# Patient Record
Sex: Female | Born: 2005 | Race: White | Hispanic: Yes | Marital: Single | Smoking: Never smoker
Health system: Southern US, Community
[De-identification: ages and names within clinical notes are randomized; demographics above are authoritative.]

## PROBLEM LIST (undated history)

## (undated) ENCOUNTER — Telehealth

## (undated) ENCOUNTER — Encounter

## (undated) ENCOUNTER — Encounter: Attending: Pediatric Nephrology | Primary: Pediatric Nephrology

## (undated) ENCOUNTER — Ambulatory Visit

## (undated) ENCOUNTER — Telehealth: Attending: Pediatric Nephrology | Primary: Pediatric Nephrology

## (undated) ENCOUNTER — Ambulatory Visit: Payer: PRIVATE HEALTH INSURANCE

## (undated) ENCOUNTER — Ambulatory Visit: Payer: PRIVATE HEALTH INSURANCE | Attending: Pediatric Nephrology | Primary: Pediatric Nephrology

## (undated) ENCOUNTER — Ambulatory Visit: Payer: Medicaid (Managed Care) | Attending: Pediatric Nephrology | Primary: Pediatric Nephrology

## (undated) ENCOUNTER — Telehealth: Attending: Internal Medicine | Primary: Internal Medicine

## (undated) ENCOUNTER — Encounter: Attending: Pediatrics | Primary: Pediatrics

## (undated) ENCOUNTER — Ambulatory Visit: Payer: PRIVATE HEALTH INSURANCE | Attending: Pediatric Infectious Diseases | Primary: Pediatric Infectious Diseases

---
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
## 2023-01-02 DIAGNOSIS — D849 Immunodeficiency, unspecified: Principal | ICD-10-CM

## 2023-01-02 DIAGNOSIS — Z940 Kidney transplant status: Principal | ICD-10-CM

## 2023-01-04 LAB — CBC W/ DIFFERENTIAL
BANDED NEUTROPHILS ABSOLUTE COUNT: 0 10*3/uL (ref 0.0–0.1)
BASOPHILS ABSOLUTE COUNT: 0 10*3/uL (ref 0.0–0.2)
BASOPHILS RELATIVE PERCENT: 1 %
EOSINOPHILS ABSOLUTE COUNT: 0.1 10*3/uL (ref 0.0–0.4)
EOSINOPHILS RELATIVE PERCENT: 3 %
HEMATOCRIT: 31.8 % — ABNORMAL LOW (ref 34.0–46.6)
HEMOGLOBIN: 10.5 g/dL — ABNORMAL LOW (ref 11.1–15.9)
IMMATURE GRANULOCYTES: 0 %
LYMPHOCYTES ABSOLUTE COUNT: 0.9 10*3/uL (ref 0.7–3.1)
LYMPHOCYTES RELATIVE PERCENT: 23 %
MEAN CORPUSCULAR HEMOGLOBIN CONC: 33 g/dL (ref 31.5–35.7)
MEAN CORPUSCULAR HEMOGLOBIN: 27.3 pg (ref 26.6–33.0)
MEAN CORPUSCULAR VOLUME: 83 fL (ref 79–97)
MONOCYTES ABSOLUTE COUNT: 0.3 10*3/uL (ref 0.1–0.9)
MONOCYTES RELATIVE PERCENT: 7 %
NEUTROPHILS ABSOLUTE COUNT: 2.6 10*3/uL (ref 1.4–7.0)
NEUTROPHILS RELATIVE PERCENT: 66 %
PLATELET COUNT: 280 10*3/uL (ref 150–450)
RED BLOOD CELL COUNT: 3.84 x10E6/uL (ref 3.77–5.28)
RED CELL DISTRIBUTION WIDTH: 12.8 % (ref 11.7–15.4)
WHITE BLOOD CELL COUNT: 3.9 10*3/uL (ref 3.4–10.8)

## 2023-01-04 LAB — RENAL FUNCTION PANEL
ALBUMIN: 4.5 g/dL (ref 4.0–5.0)
BLOOD UREA NITROGEN: 21 mg/dL — ABNORMAL HIGH (ref 6–20)
BUN / CREAT RATIO: 16 (ref 9–23)
CALCIUM: 9.5 mg/dL (ref 8.7–10.2)
CHLORIDE: 109 mmol/L — ABNORMAL HIGH (ref 96–106)
CO2: 19 mmol/L — ABNORMAL LOW (ref 20–29)
CREATININE: 1.35 mg/dL — ABNORMAL HIGH (ref 0.57–1.00)
EGFR: 58 mL/min/{1.73_m2} — ABNORMAL LOW
GLUCOSE: 94 mg/dL (ref 70–99)
PHOSPHORUS, SERUM: 3.7 mg/dL (ref 3.3–5.1)
POTASSIUM: 4.3 mmol/L (ref 3.5–5.2)
SODIUM: 141 mmol/L (ref 134–144)

## 2023-01-04 LAB — MAGNESIUM: MAGNESIUM: 1.8 mg/dL (ref 1.6–2.3)

## 2023-01-05 LAB — TACROLIMUS LEVEL: TACROLIMUS BLOOD: 5.9 ng/mL (ref 2.0–20.0)

## 2023-01-05 LAB — CMV DNA, QUANTITATIVE, PCR: CMV QUANT: NEGATIVE [IU]/mL

## 2023-01-05 NOTE — Unmapped (Signed)
refill 

## 2023-01-05 NOTE — Unmapped (Signed)
Transplant Lab Follow-Up    Patient was contacted to discuss laboratory results. Tacrolimus level was within goal range at 5.9 ng/mL (goal 5-7 ng/mL). Plan to continue current tacrolimus (Prograf) dose of 0.9 mg every 12 hours. Patient will get repeat labs in 1 month. Plan to follow-up with other labwork as results made available. All questions answered. Patient verbalized understanding & agreed with the plan.    Laboratory Results Summary     Immunosuppression  Regimen: Tacrolimus 0.9 mg every 12 hours  Trough Goal: 5-7 ng/mL    Lab Results   Component Value Date    TACROLIMUS 5.9 01/03/2023    TACROLIMUS 8.0 12/20/2022    TACROLIMUS 12.5 12/07/2022     Infectious Disease/Viral Testing    Lab Results   Component Value Date    CMV Viral Ld Not Detected 11/07/2022    CMV Viral Ld Not Detected 11/07/2022     Lab Results   Component Value Date    CMV Quant Negative 01/03/2023     Other Laboratory Results  Lab Results   Component Value Date    BUN 21 (H) 01/03/2023    CREATININE 1.35 (H) 01/03/2023    K 4.3 01/03/2023    GLU 95 11/07/2022    MG 1.8 01/03/2023     Lab Results   Component Value Date    WBC 3.9 01/03/2023    HGB 10.5 (L) 01/03/2023    HCT 31.8 (L) 01/03/2023    PLT 280 01/03/2023    NEUTROABS 2.6 01/03/2023       Melina Modena, PharmD, BCPPS, CPP  Clinical Pharmacist Practitioner - Pediatric Solid Organ Transplant

## 2023-01-06 MED ORDER — TACROLIMUS ORAL SUS 1MG/ML (CAPS)
Freq: Two times a day (BID) | ORAL | 11 refills | 30 days | Status: CP
Start: 2023-01-06 — End: ?
  Filled 2023-01-17: qty 54, 30d supply, fill #0

## 2023-01-09 DIAGNOSIS — Z940 Kidney transplant status: Principal | ICD-10-CM

## 2023-01-09 DIAGNOSIS — D849 Immunodeficiency, unspecified: Principal | ICD-10-CM

## 2023-01-09 NOTE — Unmapped (Addendum)
[facility] Pharmacist has reviewed a new prescription for tacrolimus that indicates a dose decrease.  Patient was counseled on this dosage change by cpp JP- see epic note from 7/15, 7/26, 8/8.  Next refill call date adjusted if necessary.        Clinical Assessment Needed For: Dose Change  Medication: Tacrolimus 1mg /mL oral suspension  Last Fill Date/Day Supply: 12/13/2022 / 30 days  Copay $0  Was previous dose already scheduled to fill: No    Notes to Pharmacist: N/A

## 2023-01-10 NOTE — Unmapped (Signed)
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     Was a Nurse, learning disability used for this call? No    Completed refill call assessment today to schedule patient's medication shipment from the [facility] [phone]).  All relevant notes have been reviewed.     Specialty medication(s) and dose(s) confirmed: Regimen is correct and unchanged.   Changes to medications: Lilly reports no changes at this time.  Changes to insurance: No  New side effects reported not previously addressed with a pharmacist or physician: None reported  Questions for the pharmacist: No    Confirmed patient received a Conservation officer, historic buildings and a Surveyor, mining with first shipment. The patient will receive a drug information handout for each medication shipped and additional FDA Medication Guides as required.       DISEASE/MEDICATION-SPECIFIC INFORMATION        N/A    SPECIALTY MEDICATION ADHERENCE     Medication Adherence    Patient reported X missed doses in the last month: 0  Specialty Medication: mycophenolate 200 mg/mL suspension (CELLCEPT)  Patient is on additional specialty medications: No              Were doses missed due to medication being on hold? No    Mycophenolate  200 mg/ml: 2 days of medicine on hand     REFERRAL TO PHARMACIST     Referral to the pharmacist: Not needed      [facility]     Shipping address confirmed in Epic.       Delivery Scheduled: Yes, Expected medication delivery date: 01/11/23.     Medication will be delivered via Same Day Courier to the prescription address in Epic WAM.    [name]   [facility] Specialty Technician

## 2023-01-10 NOTE — Unmapped (Signed)
[facility] Pharmacy Clinical Intervention    Type of intervention: Narrow therapeutic index drug    Medication involved: tacrolimus    Problem identified: mfg change in special formulations compound due to [place] alignment    Intervention performed: pt and clinic ok with change. Pt aware to alert clinic on switch date as bloodwork may be needed    Follow-up needed: clinic aware    Approximate time spent: 5-10 minutes    Clinical evidence used to support intervention: FDA Orange Book    Result of the intervention: Prevention of an adverse drug event    Thad Ranger, PharmD   [facility] Specialty Pharmacist

## 2023-01-10 NOTE — Unmapped (Signed)
[facility] Specialty Pharmacy Refill Coordination Note    Specialty Medication(s) to be Shipped:   Transplant: tacrolimus oral suspension 1mg /ml    Other medication(s) to be shipped: No additional medications requested for fill at this time     Michelle [name], DOB: Jun 21, 2005  Phone: [phone] (home)       All above HIPAA information was verified with patient.     Was a Nurse, learning disability used for this call? No    Completed refill call assessment today to schedule patient's medication shipment from the [facility] [phone]).  All relevant notes have been reviewed.     Specialty medication(s) and dose(s) confirmed: Regimen is correct and unchanged.   Changes to medications: Merritt reports no changes at this time.  Changes to insurance: No  New side effects reported not previously addressed with a pharmacist or physician: None reported  Questions for the pharmacist: No    Confirmed patient received a Conservation officer, historic buildings and a Surveyor, mining with first shipment. The patient will receive a drug information handout for each medication shipped and additional FDA Medication Guides as required.       DISEASE/MEDICATION-SPECIFIC INFORMATION        N/A    SPECIALTY MEDICATION ADHERENCE     Medication Adherence    Patient reported X missed doses in the last month: 0  Specialty Medication: tacrolimus 1 mg/mL oral suspension (CAPS)  Patient is on additional specialty medications: No              Were doses missed due to medication being on hold? No    tacrolimus 1 mg/ml: 10 days of medicine on hand       REFERRAL TO PHARMACIST     Referral to the pharmacist: Not needed      [facility]     Shipping address confirmed in Epic.       Delivery Scheduled: Yes, Expected medication delivery date: 01/17/23.     Medication will be delivered via Same Day Courier to the prescription address in Epic WAM.    [name]   [facility] Specialty Technician

## 2023-01-11 NOTE — Unmapped (Signed)
Vaughn Dighton 's Mycophenolate shipment will be delayed as a result of the medication is too soon to refill until 8/15.     I have reached out to the patient via interpreter at [phone]  and left a voicemail message.  We will wait for a call back from the patient to reschedule the delivery.  We have not confirmed the new delivery date.

## 2023-01-12 NOTE — Unmapped (Signed)
Michelle [name] 's mycophenolate 200 mg/mL suspension (CELLCEPT) shipment will be sent out as a result of RTS     I have spoken with the patient  via incoming phone call and communicated the delivery change. We will reschedule the medication for the delivery date that the patient agreed upon.  We have confirmed the delivery date as 01/12/2023, via same day courier.

## 2023-01-13 MED FILL — MYCOPHENOLATE MOFETIL 200 MG/ML ORAL POWDER FOR SUSPENSION: ORAL | 34 days supply | Qty: 160 | Fill #7

## 2023-01-16 DIAGNOSIS — D849 Immunodeficiency, unspecified: Principal | ICD-10-CM

## 2023-01-16 DIAGNOSIS — Z940 Kidney transplant status: Principal | ICD-10-CM

## 2023-01-23 DIAGNOSIS — Z940 Kidney transplant status: Principal | ICD-10-CM

## 2023-01-23 DIAGNOSIS — D849 Immunodeficiency, unspecified: Principal | ICD-10-CM

## 2023-01-25 DIAGNOSIS — R300 Dysuria: Principal | ICD-10-CM

## 2023-01-26 ENCOUNTER — Ambulatory Visit: Admit: 2023-01-26 | Discharge: 2023-01-27 | Payer: PRIVATE HEALTH INSURANCE

## 2023-01-26 DIAGNOSIS — R300 Dysuria: Principal | ICD-10-CM

## 2023-01-26 LAB — URINALYSIS WITH MICROSCOPY WITH CULTURE REFLEX PERFORMABLE
BILIRUBIN UA: NEGATIVE
GLUCOSE UA: NEGATIVE
KETONES UA: NEGATIVE
NITRITE UA: NEGATIVE
PH UA: 5.5 (ref 5.0–9.0)
RBC UA: 1 /HPF (ref <=4)
SPECIFIC GRAVITY UA: 1.015 (ref 1.003–1.030)
SQUAMOUS EPITHELIAL: 1 /HPF (ref 0–5)
UROBILINOGEN UA: <2
WBC UA: 5 /HPF (ref 0–5)

## 2023-01-27 DIAGNOSIS — R300 Dysuria: Principal | ICD-10-CM

## 2023-01-27 MED ORDER — SULFAMETHOXAZOLE 800 MG-TRIMETHOPRIM 160 MG TABLET
ORAL_TABLET | Freq: Two times a day (BID) | ORAL | 0 refills | 3 days | Status: CP
Start: 2023-01-27 — End: 2023-01-30

## 2023-01-27 NOTE — Unmapped (Signed)
Intermittent 10/10 bladder pain  Dysuria x 2 days  No fever  UA with trace LE and small blood  Sending bactrim

## 2023-01-30 DIAGNOSIS — Z940 Kidney transplant status: Principal | ICD-10-CM

## 2023-01-30 DIAGNOSIS — D849 Immunodeficiency, unspecified: Principal | ICD-10-CM

## 2023-02-01 LAB — CBC W/ DIFFERENTIAL
BANDED NEUTROPHILS ABSOLUTE COUNT: 0 10*3/uL (ref 0.0–0.1)
BASOPHILS ABSOLUTE COUNT: 0 10*3/uL (ref 0.0–0.2)
BASOPHILS RELATIVE PERCENT: 1 %
EOSINOPHILS ABSOLUTE COUNT: 0.1 10*3/uL (ref 0.0–0.4)
EOSINOPHILS RELATIVE PERCENT: 2 %
HEMATOCRIT: 33.4 % — ABNORMAL LOW (ref 34.0–46.6)
HEMOGLOBIN: 10.8 g/dL — ABNORMAL LOW (ref 11.1–15.9)
IMMATURE GRANULOCYTES: 0 %
LYMPHOCYTES ABSOLUTE COUNT: 0.9 10*3/uL (ref 0.7–3.1)
LYMPHOCYTES RELATIVE PERCENT: 18 %
MEAN CORPUSCULAR HEMOGLOBIN CONC: 32.3 g/dL (ref 31.5–35.7)
MEAN CORPUSCULAR HEMOGLOBIN: 26.6 pg (ref 26.6–33.0)
MEAN CORPUSCULAR VOLUME: 82 fL (ref 79–97)
MONOCYTES ABSOLUTE COUNT: 0.4 10*3/uL (ref 0.1–0.9)
MONOCYTES RELATIVE PERCENT: 8 %
NEUTROPHILS ABSOLUTE COUNT: 3.4 10*3/uL (ref 1.4–7.0)
NEUTROPHILS RELATIVE PERCENT: 71 %
PLATELET COUNT: 252 10*3/uL (ref 150–450)
RED BLOOD CELL COUNT: 4.06 x10E6/uL (ref 3.77–5.28)
RED CELL DISTRIBUTION WIDTH: 12.5 % (ref 11.7–15.4)
WHITE BLOOD CELL COUNT: 4.8 10*3/uL (ref 3.4–10.8)

## 2023-02-01 LAB — RENAL FUNCTION PANEL
ALBUMIN: 4.5 g/dL (ref 4.0–5.0)
BLOOD UREA NITROGEN: 22 mg/dL — ABNORMAL HIGH (ref 6–20)
BUN / CREAT RATIO: 16 (ref 9–23)
CALCIUM: 9.8 mg/dL (ref 8.7–10.2)
CHLORIDE: 107 mmol/L — ABNORMAL HIGH (ref 96–106)
CO2: 18 mmol/L — ABNORMAL LOW (ref 20–29)
CREATININE: 1.36 mg/dL — ABNORMAL HIGH (ref 0.57–1.00)
EGFR: 58 mL/min/{1.73_m2} — ABNORMAL LOW
GLUCOSE: 93 mg/dL (ref 70–99)
PHOSPHORUS, SERUM: 3.6 mg/dL (ref 3.3–5.1)
POTASSIUM: 4.5 mmol/L (ref 3.5–5.2)
SODIUM: 139 mmol/L (ref 134–144)

## 2023-02-01 LAB — CMV DNA, QUANTITATIVE, PCR: CMV QUANT: NEGATIVE [IU]/mL

## 2023-02-01 LAB — MAGNESIUM: MAGNESIUM: 1.6 mg/dL (ref 1.6–2.3)

## 2023-02-03 LAB — TACROLIMUS LEVEL: TACROLIMUS BLOOD: 7 ng/mL (ref 2.0–20.0)

## 2023-02-03 NOTE — Unmapped (Signed)
Transplant Lab Follow-Up    Patient was contacted to discuss laboratory results. Tacrolimus level was within goal range at 7.0 ng/mL (goal 5-7 ng/mL). Plan to continue current tacrolimus (Prograf) dose of 0.9 mg every 12 hours. Patient will get repeat labs in 1 month. Plan to follow-up with other labwork as results made available. All questions answered. Patient verbalized understanding & agreed with the plan.    Laboratory Results Summary     Immunosuppression  Regimen: Tacrolimus 0.9 mg every 12 hours  Trough Goal: 5-7 ng/mL    Lab Results   Component Value Date    TACROLIMUS 7.0 01/31/2023    TACROLIMUS 5.9 01/03/2023    TACROLIMUS 8.0 12/20/2022     Infectious Disease/Viral Testing    Lab Results   Component Value Date    CMV Viral Ld Not Detected 11/07/2022    CMV Viral Ld Not Detected 11/07/2022     Lab Results   Component Value Date    CMV Quant Negative 01/31/2023     Other Laboratory Results  Lab Results   Component Value Date    BUN 22 (H) 01/31/2023    CREATININE 1.36 (H) 01/31/2023    K 4.5 01/31/2023    GLU 95 11/07/2022    MG 1.6 01/31/2023     Lab Results   Component Value Date    WBC 4.8 01/31/2023    HGB 10.8 (L) 01/31/2023    HCT 33.4 (L) 01/31/2023    PLT 252 01/31/2023    NEUTROABS 3.4 01/31/2023       Melina Modena, PharmD, BCPPS, CPP  Clinical Pharmacist Practitioner - Pediatric Solid Organ Transplant

## 2023-02-06 DIAGNOSIS — Z940 Kidney transplant status: Principal | ICD-10-CM

## 2023-02-06 DIAGNOSIS — D849 Immunodeficiency, unspecified: Principal | ICD-10-CM

## 2023-02-08 NOTE — Unmapped (Signed)
[facility] Clinical Assessment & Refill Coordination Note    Michelle [name], DOB: [birth year]  Phone: [phone] (home)     All above HIPAA information was verified with patient's family member, Spoke to [employer].     Was a Nurse, learning disability used for this call? Yes, [id number]. Patient language is appropriate in [facility]    Specialty Medication(s):   Transplant: tacrolimus oral suspension 1mg /ml and Mycophenolate 200mg /ml     Current Outpatient Medications   Medication Sig Dispense Refill    amlodipine (NORVASC) 5 MG tablet Take 1 tablet (5 mg total) by mouth daily. 30 tablet 11    carvedilol (COREG) 25 MG tablet Take 1 tablet (25 mg total) by mouth Two (2) times a day. 60 tablet 11    mycophenolate (CELLCEPT) 200 mg/mL suspension Take 2.3 mL (460 mg total) by mouth two (2) times a day. 160 mL 11    tacrolimus 1 mg/mL oral suspension (CAPS) Take 0.9 mL (0.9 mg total) by mouth two (2) times a day. 54 mL 11     No current facility-administered medications for this visit.        Changes to medications: Chardonae reports no changes at this time.    Allergies   Allergen Reactions    Latex      Added based on information entered during case entry, please review and add reactions, type, and severity as needed       Changes to allergies: No    SPECIALTY MEDICATION ADHERENCE     Mycophenolate 200 mg/ml: 10 days of medicine on hand   Tacrolimus 1 mg/ml: 10 days of medicine on hand     Medication Adherence    Patient reported X missed doses in the last month: 0  Specialty Medication: Tacrolimus 1mg /ml  Patient is on additional specialty medications: Yes  Additional Specialty Medications: Mycophenolate 200mg /ml  Patient Reported Additional Medication X Missed Doses in the Last Month: 0  Patient is on more than two specialty medications: No          Specialty medication(s) dose(s) confirmed: Regimen is correct and unchanged.     Are there any concerns with adherence? No    Adherence counseling provided? Not needed    CLINICAL MANAGEMENT AND INTERVENTION      Clinical Benefit Assessment:    Do you feel the medicine is effective or helping your condition? Yes    Clinical Benefit counseling provided? Not needed    Adverse Effects Assessment:    Are you experiencing any side effects? No    Are you experiencing difficulty administering your medicine? No    Quality of Life Assessment:    Quality of Life    Rheumatology  Oncology  Dermatology  Cystic Fibrosis          How many days over the past month did your kidney transplant  keep you from your normal activities? For example, brushing your teeth or getting up in the morning. 0    Have you discussed this with your provider? Not needed    Acute Infection Status:    Acute infections noted within Epic:  No active infections  Patient reported infection: None    Therapy Appropriateness:    Is therapy appropriate based on current medication list, adverse reactions, adherence, clinical benefit and progress toward achieving therapeutic goals? Yes, therapy is appropriate and should be continued     DISEASE/MEDICATION-SPECIFIC INFORMATION      N/A    Solid Organ Transplant:  Not Applicable    PATIENT SPECIFIC NEEDS     Does the patient have any physical, cognitive, or cultural barriers? No    Is the patient high risk? Yes, patient is taking a REMS drug. Medication is dispensed in compliance with REMS program    Did the patient require a clinical intervention? No    Does the patient require physician intervention or other additional services (i.e., nutrition, smoking cessation, social work)? No    SOCIAL DETERMINANTS OF HEALTH     At the [facility], we have learned that life circumstances - like trouble affording food, housing, utilities, or transportation can affect the health of many of our patients.   That is why we wanted to ask: are you currently experiencing any life circumstances that are negatively impacting your health and/or quality of life? Patient declined to answer    Social Determinants of Health     Financial Resource Strain: Low Risk  (04/14/2021)    Overall Financial Resource Strain (CARDIA)     Difficulty of Paying Living Expenses: Not very hard   Internet Connectivity: Not on file   Food Insecurity: No Food Insecurity (04/14/2021)    Hunger Vital Sign     Worried About Running Out of Food in the Last Year: Never true     Ran Out of Food in the Last Year: Never true   Tobacco Use: Low Risk  (06/20/2022)    Patient History     Smoking Tobacco Use: Never     Smokeless Tobacco Use: Never     Passive Exposure: Never   Housing/Utilities: Low Risk  (04/14/2021)    Housing/Utilities     Within the past 12 months, have you ever stayed: outside, in a car, in a tent, in an overnight shelter, or temporarily in someone else's home (i.e. couch-surfing)?: No     Are you worried about losing your housing?: No     Within the past 12 months, have you been unable to get utilities (heat, electricity) when it was really needed?: No   Alcohol Use: Not At Risk (06/17/2021)    Alcohol Use     How often do you have a drink containing alcohol?: Never     How many drinks containing alcohol do you have on a typical day when you are drinking?: Not on file     How often do you have 5 or more drinks on one occasion?: Never   Transportation Needs: No Transportation Needs (04/14/2021)    PRAPARE - Transportation     Lack of Transportation (Medical): No     Lack of Transportation (Non-Medical): No   Substance Use: Low Risk  (06/17/2021)    Substance Use     Taken prescription drugs for non-medical reasons: Never     Taken illegal drugs: Never     Patient indicated they have taken drugs in the past year for non-medical reasons: Yes, [positive answer(s)]: Not on file   Health Literacy: Not on file   Physical Activity: Not on file   Interpersonal Safety: Unknown (02/08/2023)    Interpersonal Safety     Unsafe Where You Currently Live: Not on file     Physically Hurt by Anyone: Not on file Abused by Anyone: Not on file   Stress: Not on file   Intimate Partner Violence: Not on file   Depression: Not on file   Social Connections: Not on file       Would you be willing to receive help  with any of the needs that you have identified today? Not applicable       SHIPPING     Specialty Medication(s) to be Shipped:   Transplant: tacrolimus oral suspension 1mg /ml and Mycophenolate    Other medication(s) to be shipped: No additional medications requested for fill at this time     Changes to insurance: No    Delivery Scheduled: Yes, Expected medication delivery date: 02/15/23.     Medication will be delivered via Same Day Courier to the confirmed prescription address in [facility].    The patient will receive a drug information handout for each medication shipped and additional FDA Medication Guides as required.  Verified that patient has previously received a Conservation officer, historic buildings and a Surveyor, mining.    The patient or caregiver noted above participated in the development of this care plan and knows that they can request review of or adjustments to the care plan at any time.      All of the patient's questions and concerns have been addressed.    Tera Helper, [facility]   [facility] Specialty Pharmacist

## 2023-02-13 DIAGNOSIS — Z940 Kidney transplant status: Principal | ICD-10-CM

## 2023-02-13 DIAGNOSIS — D849 Immunodeficiency, unspecified: Principal | ICD-10-CM

## 2023-02-14 MED FILL — ORA-BLEND ORAL SUSPENSION, TACROLIMUS 5 MG CAPSULE, IMMEDIATE-RELEASE: ORAL | 30 days supply | Qty: 54 | Fill #0

## 2023-02-14 MED FILL — MYCOPHENOLATE MOFETIL 200 MG/ML ORAL POWDER FOR SUSPENSION: ORAL | 34 days supply | Qty: 160 | Fill #0

## 2023-02-20 DIAGNOSIS — Z940 Kidney transplant status: Principal | ICD-10-CM

## 2023-02-20 DIAGNOSIS — D849 Immunodeficiency, unspecified: Principal | ICD-10-CM

## 2023-02-27 DIAGNOSIS — Z940 Kidney transplant status: Principal | ICD-10-CM

## 2023-02-27 DIAGNOSIS — D849 Immunodeficiency, unspecified: Principal | ICD-10-CM

## 2023-03-06 ENCOUNTER — Other Ambulatory Visit: Admit: 2023-03-06 | Discharge: 2023-03-07 | Payer: PRIVATE HEALTH INSURANCE

## 2023-03-06 DIAGNOSIS — Z940 Kidney transplant status: Principal | ICD-10-CM

## 2023-03-06 DIAGNOSIS — N189 Chronic kidney disease, unspecified: Principal | ICD-10-CM

## 2023-03-06 DIAGNOSIS — D849 Immunodeficiency, unspecified: Principal | ICD-10-CM

## 2023-03-06 DIAGNOSIS — D631 Anemia in chronic kidney disease: Principal | ICD-10-CM

## 2023-03-06 LAB — CBC W/ AUTO DIFF
BASOPHILS ABSOLUTE COUNT: 0 10*9/L (ref 0.0–0.1)
BASOPHILS RELATIVE PERCENT: 0.8 %
EOSINOPHILS ABSOLUTE COUNT: 0.1 10*9/L (ref 0.0–0.5)
EOSINOPHILS RELATIVE PERCENT: 2.2 %
HEMATOCRIT: 35 % (ref 34.0–44.0)
HEMOGLOBIN: 11.4 g/dL (ref 11.3–14.9)
LYMPHOCYTES ABSOLUTE COUNT: 0.6 10*9/L — ABNORMAL LOW (ref 1.1–3.6)
LYMPHOCYTES RELATIVE PERCENT: 13.3 %
MEAN CORPUSCULAR HEMOGLOBIN CONC: 32.6 g/dL (ref 32.3–35.0)
MEAN CORPUSCULAR HEMOGLOBIN: 26.6 pg (ref 25.9–32.4)
MEAN CORPUSCULAR VOLUME: 81.5 fL (ref 77.6–95.7)
MEAN PLATELET VOLUME: 9.8 fL (ref 7.3–10.7)
MONOCYTES ABSOLUTE COUNT: 0.3 10*9/L (ref 0.3–0.8)
MONOCYTES RELATIVE PERCENT: 6.4 %
NEUTROPHILS ABSOLUTE COUNT: 3.7 10*9/L (ref 1.5–6.4)
NEUTROPHILS RELATIVE PERCENT: 77.3 %
PLATELET COUNT: 221 10*9/L (ref 170–380)
RED BLOOD CELL COUNT: 4.29 10*12/L (ref 3.95–5.13)
RED CELL DISTRIBUTION WIDTH: 13.2 % (ref 12.2–15.2)
WBC ADJUSTED: 4.8 10*9/L (ref 4.2–10.2)

## 2023-03-06 LAB — RENAL FUNCTION PANEL
ALBUMIN: 4.3 g/dL (ref 3.4–5.0)
ANION GAP: 7 mmol/L (ref 5–14)
BLOOD UREA NITROGEN: 22 mg/dL (ref 9–23)
BUN / CREAT RATIO: 17
CALCIUM: 9.9 mg/dL (ref 8.7–10.4)
CHLORIDE: 111 mmol/L — ABNORMAL HIGH (ref 98–107)
CO2: 21.7 mmol/L (ref 20.0–31.0)
CREATININE: 1.31 mg/dL — ABNORMAL HIGH
EGFR CKD-EPI (2021) FEMALE: 60 mL/min/{1.73_m2} (ref >=60)
GLUCOSE RANDOM: 92 mg/dL (ref 70–99)
PHOSPHORUS: 3.6 mg/dL (ref 2.4–5.1)
POTASSIUM: 4.3 mmol/L (ref 3.4–4.8)
SODIUM: 140 mmol/L (ref 135–145)

## 2023-03-06 LAB — IRON PANEL
IRON SATURATION: 15 % — ABNORMAL LOW (ref 20–55)
IRON: 58 ug/dL
TOTAL IRON BINDING CAPACITY: 377 ug/dL (ref 250–425)

## 2023-03-06 LAB — BK VIRUS QUANTITATIVE PCR, BLOOD: BK BLOOD RESULT: NOT DETECTED

## 2023-03-06 LAB — CMV DNA, QUANTITATIVE, PCR: CMV VIRAL LD: NOT DETECTED

## 2023-03-06 LAB — MAGNESIUM: MAGNESIUM: 1.7 mg/dL (ref 1.6–2.6)

## 2023-03-06 LAB — FERRITIN: FERRITIN: 9.1 ng/mL

## 2023-03-06 LAB — EBV QUANTITATIVE PCR, BLOOD: EBV VIRAL LOAD RESULT: NOT DETECTED

## 2023-03-06 LAB — TACROLIMUS LEVEL, TROUGH: TACROLIMUS, TROUGH: 5.4 ng/mL (ref 5.0–15.0)

## 2023-03-07 NOTE — Unmapped (Signed)
Transplant Lab Follow-Up    Patient was contacted to discuss laboratory results. Tacrolimus level was within goal range at 5.4 ng/mL (goal 5-7 ng/mL). Plan to continue current tacrolimus (Prograf) dose of 0.9 mg every 12 hours. Patient will get repeat labs in 1 month. Plan to follow-up with other labwork as results made available. All questions answered. Patient verbalized understanding & agreed with the plan.    Laboratory Results Summary     Immunosuppression  Regimen: Tacrolimus 0.9 mg every 12 hours  Trough Goal: 5-7 ng/mL    Lab Results   Component Value Date    TACROLIMUS 5.4 03/06/2023    TACROLIMUS 7.0 01/31/2023    TACROLIMUS 5.9 01/03/2023     Infectious Disease/Viral Testing    Lab Results   Component Value Date    CMV Viral Ld Not Detected 03/06/2023     Lab Results   Component Value Date    CMV Quant Negative 01/31/2023     Other Laboratory Results  Lab Results   Component Value Date    BUN 22 03/06/2023    CREATININE 1.31 (H) 03/06/2023    K 4.3 03/06/2023    GLU 92 03/06/2023    MG 1.7 03/06/2023     Lab Results   Component Value Date    WBC 4.8 03/06/2023    HGB 11.4 03/06/2023    HCT 35.0 03/06/2023    PLT 221 03/06/2023    NEUTROABS 3.7 03/06/2023       Melina Modena, PharmD, BCPPS, CPP  Clinical Pharmacist Practitioner - Pediatric Solid Organ Transplant

## 2023-03-08 LAB — HLA DS POST TRANSPLANT
ANTI-DONOR DRW #1 MFI: 63 MFI
ANTI-DONOR HLA-A #1 MFI: 177 MFI
ANTI-DONOR HLA-A #2 MFI: 33 MFI
ANTI-DONOR HLA-B #1 MFI: 8 MFI
ANTI-DONOR HLA-B #2 MFI: 0 MFI
ANTI-DONOR HLA-C #1 MFI: 0 MFI
ANTI-DONOR HLA-DQB #1 MFI: 0 MFI
ANTI-DONOR HLA-DQB #2 MFI: 121 MFI
ANTI-DONOR HLA-DR #1 MFI: 42 MFI
ANTI-DONOR HLA-DR #2 MFI: 273 MFI

## 2023-03-08 LAB — FSAB CLASS 2 ANTIBODY SPECIFICITY
CLASS 2 ANTIBODIES IDENTIFIED: 1:1 {titer}
HLA CL2 AB RESULT: POSITIVE

## 2023-03-08 LAB — FSAB CLASS 1 ANTIBODY SPECIFICITY: HLA CLASS 1 ANTIBODY RESULT: NEGATIVE

## 2023-03-13 DIAGNOSIS — D849 Immunodeficiency, unspecified: Principal | ICD-10-CM

## 2023-03-13 DIAGNOSIS — Z940 Kidney transplant status: Principal | ICD-10-CM

## 2023-03-13 NOTE — Unmapped (Signed)
[facility] Refill Coordination Note    Specialty Medication(s) to be Shipped:   Transplant: tacrolimus oral suspension 1mg /ml    Other medication(s) to be shipped: No additional medications requested for fill at this time     Michelle [name], DOB: 2006-05-03  Phone: [phone] (home)       All above HIPAA information was verified with patient.     Was a Nurse, learning disability used for this call? No    Completed refill call assessment today to schedule patient's medication shipment from the [facility]  [phone]).  All relevant notes have been reviewed.     Specialty medication(s) and dose(s) confirmed: Regimen is correct and unchanged.   Changes to medications: Michelle [name] reports no changes at this time.  Changes to insurance: No  New side effects reported not previously addressed with a pharmacist or physician: None reported  Questions for the pharmacist: No    Confirmed patient received a Conservation officer, historic buildings and a Surveyor, mining with first shipment. The patient will receive a drug information handout for each medication shipped and additional FDA Medication Guides as required.       DISEASE/MEDICATION-SPECIFIC INFORMATION        N/A    SPECIALTY MEDICATION ADHERENCE     Medication Adherence    Patient reported X missed doses in the last month: 0  Specialty Medication: tacrolimus 1 mg/mL oral suspension (CAPS)  Patient is on additional specialty medications: No              Were doses missed due to medication being on hold? No    tacrolimus 1mg /ml: 4 days of medicine on hand       REFERRAL TO PHARMACIST     Referral to the pharmacist: Not needed      [facility]     Shipping address confirmed in Epic.       Delivery Scheduled: Yes, Expected medication delivery date: 03/16/23.     Medication will be delivered via Same Day Courier to the prescription address in Epic WAM.    Michelle [name]   [facility]  Specialty Technician

## 2023-03-13 NOTE — Unmapped (Signed)
[facility] Delivery Pharm[facility] Refill Coordination Note    Specialty Medication(s) to be Shipped:   Transplant: Cellcept suspension 200mg /ml    Other medication(s) to be shipped: No additional medications requested for fill at this time     Michelle [name], DOB: Mar 28, 2006  Phone: [phone] (home)       All above HIPAA information was verified with patient.     Was a Nurse, learning disability used for this call? No    Completed refill call assessment today to schedule patient's medication shipment from the [facility] Delivery Pharmacy  [phone]).  All relevant notes have been reviewed.     Specialty medication(s) and dose(s) confirmed: Regimen is correct and unchanged.   Changes to medications: Beneva reports no changes at this time.  Changes to insurance: No  New side effects reported not previously addressed with a pharmacist or physician: None reported  Questions for the pharmacist: No    Confirmed patient received a Conservation officer, historic buildings and a Surveyor, mining with first shipment. The patient will receive a drug information handout for each medication shipped and additional FDA Medication Guides as required.       DISEASE/MEDICATION-SPECIFIC INFORMATION        N/A    SPECIALTY MEDICATION ADHERENCE     Medication Adherence    Patient reported X missed doses in the last month: 0  Specialty Medication: mycophenolate 200 mg/mL suspension (CELLCEPT)  Patient is on additional specialty medications: No              Were doses missed due to medication being on hold? No    mycophenolate 200 mg/ml: 1 days of medicine on hand       REFERRAL TO PHARMACIST     Referral to the pharmacist: Not needed      [facility]     Shipping address confirmed in Epic.       Delivery Scheduled: Yes, Expected medication delivery date: 03/14/23.     Medication will be delivered via Same Day Courier to the prescription address in Epic WAM.    [name]   [facility]  Specialty Technician

## 2023-03-14 LAB — ALLOSURE KIDNEY: ALLOSURE: 0.24 %

## 2023-03-14 MED FILL — MYCOPHENOLATE MOFETIL 200 MG/ML ORAL POWDER FOR SUSPENSION: ORAL | 34 days supply | Qty: 160 | Fill #1

## 2023-03-16 MED FILL — ORA-BLEND ORAL SUSPENSION, TACROLIMUS 5 MG CAPSULE, IMMEDIATE-RELEASE: ORAL | 30 days supply | Qty: 54 | Fill #0

## 2023-03-20 DIAGNOSIS — D849 Immunodeficiency, unspecified: Principal | ICD-10-CM

## 2023-03-20 DIAGNOSIS — Z940 Kidney transplant status: Principal | ICD-10-CM

## 2023-03-27 DIAGNOSIS — Z940 Kidney transplant status: Principal | ICD-10-CM

## 2023-03-27 DIAGNOSIS — D849 Immunodeficiency, unspecified: Principal | ICD-10-CM

## 2023-04-03 DIAGNOSIS — Z940 Kidney transplant status: Principal | ICD-10-CM

## 2023-04-03 DIAGNOSIS — D849 Immunodeficiency, unspecified: Principal | ICD-10-CM

## 2023-04-07 LAB — CBC W/ DIFFERENTIAL
BANDED NEUTROPHILS ABSOLUTE COUNT: 0 10*3/uL (ref 0.0–0.1)
BASOPHILS ABSOLUTE COUNT: 0 10*3/uL (ref 0.0–0.2)
BASOPHILS RELATIVE PERCENT: 0 %
EOSINOPHILS ABSOLUTE COUNT: 0.1 10*3/uL (ref 0.0–0.4)
EOSINOPHILS RELATIVE PERCENT: 1 %
HEMATOCRIT: 34.2 % (ref 34.0–46.6)
HEMOGLOBIN: 10.9 g/dL — ABNORMAL LOW (ref 11.1–15.9)
IMMATURE GRANULOCYTES: 0 %
LYMPHOCYTES ABSOLUTE COUNT: 0.9 10*3/uL (ref 0.7–3.1)
LYMPHOCYTES RELATIVE PERCENT: 18 %
MEAN CORPUSCULAR HEMOGLOBIN CONC: 31.9 g/dL (ref 31.5–35.7)
MEAN CORPUSCULAR HEMOGLOBIN: 26.4 pg — ABNORMAL LOW (ref 26.6–33.0)
MEAN CORPUSCULAR VOLUME: 83 fL (ref 79–97)
MONOCYTES ABSOLUTE COUNT: 0.3 10*3/uL (ref 0.1–0.9)
MONOCYTES RELATIVE PERCENT: 7 %
NEUTROPHILS ABSOLUTE COUNT: 3.5 10*3/uL (ref 1.4–7.0)
NEUTROPHILS RELATIVE PERCENT: 74 %
PLATELET COUNT: 250 10*3/uL (ref 150–450)
RED BLOOD CELL COUNT: 4.13 x10E6/uL (ref 3.77–5.28)
RED CELL DISTRIBUTION WIDTH: 12.7 % (ref 11.7–15.4)
WHITE BLOOD CELL COUNT: 4.8 10*3/uL (ref 3.4–10.8)

## 2023-04-07 LAB — RENAL FUNCTION PANEL
ALBUMIN: 4.7 g/dL (ref 4.0–5.0)
BLOOD UREA NITROGEN: 26 mg/dL — ABNORMAL HIGH (ref 6–20)
BUN / CREAT RATIO: 19 (ref 9–23)
CALCIUM: 9.6 mg/dL (ref 8.7–10.2)
CHLORIDE: 107 mmol/L — ABNORMAL HIGH (ref 96–106)
CO2: 17 mmol/L — ABNORMAL LOW (ref 20–29)
CREATININE: 1.39 mg/dL — ABNORMAL HIGH (ref 0.57–1.00)
EGFR: 56 mL/min/{1.73_m2} — ABNORMAL LOW
GLUCOSE: 91 mg/dL (ref 70–99)
PHOSPHORUS, SERUM: 3.9 mg/dL (ref 3.3–5.1)
POTASSIUM: 4.4 mmol/L (ref 3.5–5.2)
SODIUM: 139 mmol/L (ref 134–144)

## 2023-04-07 LAB — CMV DNA, QUANTITATIVE, PCR: CMV QUANT: NEGATIVE [IU]/mL

## 2023-04-07 LAB — MAGNESIUM: MAGNESIUM: 1.8 mg/dL (ref 1.6–2.3)

## 2023-04-07 NOTE — Unmapped (Signed)
[facility] Pediatric Transplant Pharmacist Visit      Visit Summary  No medication changes    Subjective     Michelle [name] is a 17 y.o. 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 Michelle [name] reports that everything is going well and has no questions or concerns today.    Interval History:  Illness: None    Swelling: None    Missed Doses: None      Transplant Information:  KDPI: 15%  CMV Status: Moderate Risk (D+/R+)  EBV Status: Moderate Risk (D+/R+)  Induction Agent: Alemtuzumab (Campath)    Rejection Episodes:  None    Laboratory Values     Tacrolimus: Goal trough: 5-7 ng/mL; Takes at 0800/2000  Lab Results   Component Value Date    TACROLIMUS 5.8 04/06/2023    TACROLIMUS 5.4 03/06/2023    TACROLIMUS 7.0 01/31/2023     Chemistry:   Lab Results   Component Value Date    NA 139 04/06/2023    K 4.4 04/06/2023    CL 107 (H) 04/06/2023    CO2 17 (L) 04/06/2023    BUN 26 (H) 04/06/2023    CREATININE 1.39 (H) 04/06/2023    GLU 92 03/06/2023    CALCIUM 9.6 04/06/2023    MG 1.8 04/06/2023    MG 1.7 03/06/2023    PHOS 3.6 03/06/2023      Hemoglobin A1c:   Lab Results   Component Value Date    A1C 5.2 06/17/2022     Liver Function Tests:   Lab Results   Component Value Date    AST 18 06/17/2022    AST 16 08/02/2021    ALT <7 (L) 06/17/2022    ALT <7 (L) 08/02/2021    ALKPHOS 71 06/17/2022    ALKPHOS 70 08/02/2021    GGT 10 04/20/2021    ALBUMIN 4.3 03/06/2023    ALBUMIN 4.4 11/07/2022     CBC:   Lab Results   Component Value Date    WBC 4.8 04/06/2023    WBC 4.8 03/06/2023    HGB 10.9 (L) 04/06/2023    HGB 11.4 03/06/2023    HCT 34.2 04/06/2023    HCT 35.0 03/06/2023    PLT 250 04/06/2023    PLT 221 03/06/2023    NEUTROABS 3.5 04/06/2023    NEUTROABS 3.7 03/06/2023    LYMPHSABS 0.9 04/06/2023    LYMPHSABS 0.6 (L) 03/06/2023     Vitamin D Levels: Goal > 20  Lab Results   Component Value Date    VITDTOTAL 32.3 11/07/2022    VITDTOTAL 30.4 06/17/2022    VITDTOTAL 15.2 (L) 07/05/2021     Iron Studies:  Lab Results   Component Value Date    IRON 58 03/06/2023    TIBC 377 03/06/2023    FERRITIN 9.1 03/06/2023     Lab Results   Component Value Date    Iron Saturation (%) 15 (L) 03/06/2023     Infectious Disease:  CMV:   Lab Results   Component Value Date    CMVLR Not Detected 03/06/2023    CMVLR Not Detected 11/07/2022    CMVLR Not Detected 11/07/2022    CMVLR Not Detected 08/01/2022     Lab Results   Component Value Date    CMVCP Negative 04/06/2023    CMVCP Negative 01/31/2023    CMVCP Negative 01/03/2023    CMVCP Negative 12/20/2022  Medication Review     All medications were updated in EPIC medication profile, and any medications not currently part of prescribed medication regimen have been discontinued from the medication profile.     Immunosuppression:  Calcineurin Inhibitor: Tacrolimus  Trough Goal: 5-7 ng/mL  Dose: 0.9 mg every 12 hours  Dosage Form: Oral Suspension  Route: Oral  Anti-proliferative: Mycophenolate Mofetil (Cellcept)  Dose: 460 mg (264 mg/m2) twice daily  Dosage Form: Oral Suspension  Route: Oral    Cardiovascular:  Goal Blood Pressure: < 130/80 mmHg  Hypertension Regimen:   Carvedilol 25 mg tablet - 1 tablet (25 mg) by mouth twice daily    Endocrine:  History of diabetes mellitus prior to transplant: No  Diagnosed with new onset diabetes after transplant (NODAT): No    Gynecology:   Patient is a female of childbearing age/status.     Assessment/Recommendations     Immunosuppression  Calcineurin Inhibitor: Tacrolimus 0.9 mg every 12 hours  Trough is within goal.   Adverse effect assessment: Patient is tolerating immunosuppression well and reports intermittent hand shaking.  Antiproliferative: Mycophenolate Mofetil (Cellcept) 460 mg (272 mg/m2) twice daily  Adverse effect assessment: Patient is tolerating immunosuppression well and reports no concerning adverse effects at this time.     Recommendation: Continue current dose and monitor for adverse effects    Cardiovascular  Hypertension  Assessment: Blood pressure reading was within goal.   110/69 mmHg on 04/10/23   Recommendations: Continue current regimen of carvedilol and monitor for lightheadedness and dizziness    FEN/GI  Electrolyte Management  Potassium is within normal limits.    Magnesium is within normal limits.    Phosphorus is within normal limits.      Medication Adherence  Patient has good understanding of medications and was able to independently identify names/doses of immunosuppressants and other medications.     Access: No issues noted in obtaining medications     Preferred Pharmacy: [facility]    Prescription Renewals: None    Kari Baars, PharmD  PGY-1 Ambulatory Care Pharmacy Resident    I spent a total of 10 minutes on the face-to-face visit with the patient delivering clinical care and providing education/counseling.

## 2023-04-09 LAB — TACROLIMUS LEVEL: TACROLIMUS BLOOD: 5.8 ng/mL (ref 2.0–20.0)

## 2023-04-10 ENCOUNTER — Ambulatory Visit
Admit: 2023-04-10 | Discharge: 2023-04-11 | Payer: PRIVATE HEALTH INSURANCE | Attending: Pediatric Nephrology | Primary: Pediatric Nephrology

## 2023-04-10 DIAGNOSIS — Z940 Kidney transplant status: Principal | ICD-10-CM

## 2023-04-10 DIAGNOSIS — D849 Immunodeficiency, unspecified: Principal | ICD-10-CM

## 2023-04-10 DIAGNOSIS — N182 Chronic kidney disease, stage 2 (mild): Principal | ICD-10-CM

## 2023-04-10 DIAGNOSIS — I151 Hypertension secondary to other renal disorders: Principal | ICD-10-CM

## 2023-04-10 LAB — PROTEIN / CREATININE RATIO, URINE
CREATININE, URINE: 196.1 mg/dL
PROTEIN URINE: 44.6 mg/dL
PROTEIN/CREAT RATIO, URINE: 0.227

## 2023-04-10 NOTE — Unmapped (Signed)
Addended by: Sheran Fava B on: 04/10/2023 03:59 PM     Modules accepted: Orders

## 2023-04-10 NOTE — Unmapped (Signed)
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 Biopsy 06/17/21 showed cni toxicity only, no rejection, performed for high creatinine (mid 1s). Allosure was great on 12/09/21 at 0.28.   Urinalysis: 1+ blood, always has a little bit of blood.   Urine protein/creatinine ratio: No protein in UA   DSA: None, last checked 03/06/23     # Immunosuppression:   - Prograf level 5.2, goal 5-7  - MMF 460mg  BID, AUC done 4 weeks post transplant  - Changes in Immunosuppression: none  - Medications side effects: none     # HTN - controlled  - B.P - 112/71 goal is 110/65 (50th percentile).   - Continue carvedilol 25mg  BID. No changes.     # Weight gain  Discussed incorporating regular exercise and healthy food choices, I suspect it's due to a combination of getting a transplant and graduating from high school, getting no exercise.      # Anemia - not at goal  Goal for Hemoglobin >10, currently at goal  Iron sat is 15% in Oct 2024, she prefers not to add a medication     # Infectious disease  EBV D+/R+, CMV D+/R+  Has some issues with CMV viremia, but not in the past 5 months.  All three viruses negative 03/06/23     # MBD - Calcium/Phosphorus   PTH 06/15/21: 69.1 no need to keep checking until GFR drops.      # Electrolytes:   No issues.      # Immunizations:   Immunization History   Administered Date(s) Administered    COVID-19 VAC,BIVALENT(61YR UP),PFIZER 04/15/2021    COVID-19 VACC,MRNA,(PFIZER)(PF) 01/16/2020, 02/06/2020    DTaP / IPV 01/16/2008    DTaP, Unspecified Formulation 02/17/2004, 06/21/2004, 08/02/2004, 04/19/2005    HPV Quadrivalent (Gardasil) 01/09/2015, 03/27/2015, 07/24/2015    Hepatitis A Vaccine - Unspecified Formulation 01/17/2005, 07/25/2005    Hepatitis B Vaccine, Unspecified Formulation 10/29/2003, 02/17/2004, 08/02/2004    Hepatitis B vaccine, pediatric/adolescent dosage, 04/15/2021    HiB, unspecified 02/17/2004, 06/21/2004, 08/02/2004    HiB-PRP-OMP 04/19/2005    Influenza Vaccine Quad(IM)6 MO-Adult(PF) 04/09/2021    MENINGOCOCCAL VACCINE, A,C,Y, W-135(IM)(MENVEO) 10/10/2020    MMR 01/17/2005, 01/16/2008    Meningococcal B Vaccine, OMV Adjuvanted(Bexsero) 10/10/2020    Meningococcal Conjugate MCV4P 01/09/2015    Pneumococcal Conjugate 13-Valent 04/15/2021    Pneumococcal conjugate -PCV7 02/17/2004, 06/21/2004, 08/02/2004, 01/17/2005    Polio Virus Vaccine, Unspecified Formulation 02/17/2004, 06/21/2004, 04/19/2005    TdaP 01/09/2015    Varicella 01/17/2005, 01/16/2008        # Follow up:  Labs monthly  Visits: every 3 months, next in Feb  Echo: 03/08/22  [place] with natives: Jan 2024  CXR: Jan 2024  EKG: 03/2021    I personally spent 35 minutes face-to-face and non-face-to-face in the care of this patient, which includes all pre, intra, and post visit time on the date of service.    History of Presenting Illness:     Michelle [name] is a 17 y.o. 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 CMV  and EBV positive before time of transplant.     Biopsy 06/17/21 for high creatinine when it was 1.6-1.8 without explanation showed no rejection, maybe some calcineurin inhibitor toxicity, acute tubular injury.     Allosure 12/09/21 when creatinine was 1.7 was reassuring at 0.28.     She can't swallow capsules. She lets tablets dissolve in her mouth.     Switching jobs to make more money. Doesn't want to do more school right now. Failed anatomy, had planned to be a nurse. Working in [employer].     Physical Exam:   BP 110/69 (BP Site: L Arm, BP Position: Sitting, BP Cuff Size: Medium)  - Pulse 68  - Temp 36.3 ??C (97.4 ??F) (Temporal)  - Ht 159.2 cm (5' 2.68)  - Wt 71.7 kg (158 lb)  - LMP 03/15/2023 (Approximate)  - BMI 28.28 kg/m??   87 %ile (Z= 1.12) based on CDC (Girls, 2-20 Years) weight-for-age data using data from 04/10/2023.  26 %ile (Z= -0.63) based on CDC (Girls, 2-20 Years) Stature-for-age data based on Stature recorded on 04/10/2023.  Blood pressure %iles are not available for patients who are 18 years or older.  91 %ile (Z= 1.34) based on CDC (Girls, 2-20 Years) BMI-for-age based on BMI available on 04/10/2023.    General Appearance:  Healthy-appearing, well nourished, alert, interactive  HEENT: Sclerae white, EOMI, moist mucous membranes  Pulm: Normal RR and WOB   Renal:  Extremities without edema  Neuro: Alert; normal tone throughout      Renal Transplant History:     Age of recipient (at time of transplant): 17              Cause of kidney disease: hypodysplasia/nephronophthisis               Native biopsy: no              Date of transplant: 04/21/2021              Type of transplant: KDPI 15%                          - Donor creatinine: Initial 0.7, peak 0.7, terminal 0.3                          - Any comorbidities: Diabetes, MI/stroke                          - HLA: A:1,2; B 8,57; DR: 17,7                          - Ischemia time: 647 min                          - Crossmatch: yes                           - Donor kidney biopsy: No                 Induction:               Maintenance IS at the time of transplant: [place]              DGF: No              Diabetes  Onset after transplant: No    Current Medications:   Current Outpatient Medications   Medication Sig Dispense Refill    carvedilol (COREG) 25 MG tablet Take 1 tablet (25 mg total) by mouth Two (2) times a day. 60 tablet 11    mycophenolate (CELLCEPT) 200 mg/mL suspension Take 2.3 mL (460 mg total) by mouth two (2) times a day. 160 mL 11    tacrolimus 1 mg/mL oral suspension (CAPS) Take 0.9 mL (0.9 mg total) by mouth two (2) times a day. 54 mL 11     No current facility-administered medications for this visit.       Past Medical History: healthy before ESRD onset      No results found for this or any previous visit (from the past 72 hour(s)).          Now at a year  Come back in 3 months

## 2023-04-11 NOTE — Unmapped (Signed)
[facility] Specialty and Home Delivery Pharmacy Refill Coordination Note    Specialty Medication(s) to be Shipped:   Transplant: tacrolimus oral suspension 1mg /ml and Mycopehnolate 200mg /ml    Other medication(s) to be shipped: No additional medications requested for fill at this time     Michelle [name], DOB: 06-Nov-2005  Phone: [phone] (home)       All above HIPAA information was verified with patient.     Was a Nurse, learning disability used for this call? No    Completed refill call assessment today to schedule patient's medication shipment from the [facility] and [facility] Delivery [facility]  [phone]).  All relevant notes have been reviewed.     Specialty medication(s) and dose(s) confirmed: Regimen is correct and unchanged.   Changes to medications: [name] reports no changes at this time.  Changes to insurance: No  New side effects reported not previously addressed with a pharmacist or physician: None reported  Questions for the pharmacist: No    Confirmed patient received a Conservation officer, historic buildings and a Surveyor, mining with first shipment. The patient will receive a drug information handout for each medication shipped and additional FDA Medication Guides as required.       DISEASE/MEDICATION-SPECIFIC INFORMATION        N/A    SPECIALTY MEDICATION ADHERENCE     Medication Adherence    Patient reported X missed doses in the last month: 0  Specialty Medication: tacrolimus 1 mg/mL oral suspension (CAPS)  Patient is on additional specialty medications: Yes  Additional Specialty Medications: mycophenolate 200 mg/mL suspension (CELLCEPT)  Patient Reported Additional Medication X Missed Doses in the Last Month: 0  Patient is on more than two specialty medications: No  Informant: patient              Were doses missed due to medication being on hold? No    Mycophenolate 200 mg/ml: 3 days of medicine on hand   Tacrolimus 1 mg/ml: 3 days of medicine on hand       REFERRAL TO PHARMACIST     Referral to the pharmacist: Not needed      [facility]     Shipping address confirmed in Epic.       Delivery Scheduled: Yes, Expected medication delivery date: 04/14/23.     Medication will be delivered via Same Day Courier to the prescription address in Epic WAM.    [name]   [facility] Specialty and Home Delivery Pharm[facility]  Specialty Technician

## 2023-04-14 MED FILL — MYCOPHENOLATE MOFETIL 200 MG/ML ORAL POWDER FOR SUSPENSION: ORAL | 34 days supply | Qty: 160 | Fill #2

## 2023-04-14 MED FILL — ORA-BLEND ORAL SUSPENSION, TACROLIMUS 5 MG CAPSULE, IMMEDIATE-RELEASE: ORAL | 30 days supply | Qty: 54 | Fill #1

## 2023-04-17 DIAGNOSIS — Z940 Kidney transplant status: Principal | ICD-10-CM

## 2023-04-17 DIAGNOSIS — D849 Immunodeficiency, unspecified: Principal | ICD-10-CM

## 2023-04-21 ENCOUNTER — Ambulatory Visit: Admit: 2023-04-21 | Discharge: 2023-04-22 | Payer: PRIVATE HEALTH INSURANCE

## 2023-04-24 DIAGNOSIS — D849 Immunodeficiency, unspecified: Principal | ICD-10-CM

## 2023-05-01 DIAGNOSIS — D849 Immunodeficiency, unspecified: Principal | ICD-10-CM

## 2023-05-08 DIAGNOSIS — D849 Immunodeficiency, unspecified: Principal | ICD-10-CM

## 2023-05-08 DIAGNOSIS — D631 Anemia in chronic kidney disease: Principal | ICD-10-CM

## 2023-05-08 DIAGNOSIS — Z940 Kidney transplant status: Principal | ICD-10-CM

## 2023-05-08 DIAGNOSIS — N189 Chronic kidney disease, unspecified: Principal | ICD-10-CM

## 2023-05-10 NOTE — Unmapped (Signed)
[facility] Specialty and Home Delivery Pharmacy Refill Coordination Note    Specialty Medication(s) to be Shipped:   Transplant: Cellcept suspension 200mg /ml and tacrolimus oral suspension 1mg /ml    Other medication(s) to be shipped: No additional medications requested for fill at this time     Michelle [name], DOB: 2005-12-06  Phone: [phone] (home)       All above HIPAA information was verified with patient.     Was a Nurse, learning disability used for this call? No    Completed refill call assessment today to schedule patient's medication shipment from the [facility] and Home Delivery Pharmacy  [phone]).  All relevant notes have been reviewed.     Specialty medication(s) and dose(s) confirmed: Regimen is correct and unchanged.   Changes to medications: Briceida reports no changes at this time.  Changes to insurance: No  New side effects reported not previously addressed with a pharmacist or physician: None reported  Questions for the pharmacist: No    Confirmed patient received a Conservation officer, historic buildings and a Surveyor, mining with first shipment. The patient will receive a drug information handout for each medication shipped and additional FDA Medication Guides as required.       DISEASE/MEDICATION-SPECIFIC INFORMATION        N/A    SPECIALTY MEDICATION ADHERENCE     Medication Adherence    Patient reported X missed doses in the last month: 0  Specialty Medication: mycophenolate 200 mg/mL suspension (CELLCEPT)  Patient is on additional specialty medications: Yes  Additional Specialty Medications: tacrolimus 1 mg/mL oral suspension (CAPS)  Patient Reported Additional Medication X Missed Doses in the Last Month: 0  Patient is on more than two specialty medications: No              Were doses missed due to medication being on hold? No    tacrolimus 1 mg/ml: 2 days of medicine on hand   mycophenolate 200 mg/ml: 2 days of medicine on hand       REFERRAL TO PHARMACIST     Referral to the pharmacist: Not needed      [facility]     Shipping address confirmed in Epic.       Delivery Scheduled: Yes, Expected medication delivery date: 05/12/23.     Medication will be delivered via Same Day Courier to the prescription address in Epic WAM.    [name]   [facility] Specialty and Home Delivery Pharm[facility]  Specialty Technician

## 2023-05-11 LAB — CBC W/ DIFFERENTIAL
BANDED NEUTROPHILS ABSOLUTE COUNT: 0 10*3/uL (ref 0.0–0.1)
BASOPHILS ABSOLUTE COUNT: 0 10*3/uL (ref 0.0–0.2)
BASOPHILS RELATIVE PERCENT: 1 %
EOSINOPHILS ABSOLUTE COUNT: 0.1 10*3/uL (ref 0.0–0.4)
EOSINOPHILS RELATIVE PERCENT: 2 %
HEMATOCRIT: 32.5 % — ABNORMAL LOW (ref 34.0–46.6)
HEMOGLOBIN: 10.4 g/dL — ABNORMAL LOW (ref 11.1–15.9)
IMMATURE GRANULOCYTES: 0 %
LYMPHOCYTES ABSOLUTE COUNT: 1 10*3/uL (ref 0.7–3.1)
LYMPHOCYTES RELATIVE PERCENT: 21 %
MEAN CORPUSCULAR HEMOGLOBIN CONC: 32 g/dL (ref 31.5–35.7)
MEAN CORPUSCULAR HEMOGLOBIN: 26.5 pg — ABNORMAL LOW (ref 26.6–33.0)
MEAN CORPUSCULAR VOLUME: 83 fL (ref 79–97)
MONOCYTES ABSOLUTE COUNT: 0.3 10*3/uL (ref 0.1–0.9)
MONOCYTES RELATIVE PERCENT: 7 %
NEUTROPHILS ABSOLUTE COUNT: 3.4 10*3/uL (ref 1.4–7.0)
NEUTROPHILS RELATIVE PERCENT: 69 %
PLATELET COUNT: 230 10*3/uL (ref 150–450)
RED BLOOD CELL COUNT: 3.92 x10E6/uL (ref 3.77–5.28)
RED CELL DISTRIBUTION WIDTH: 13.1 % (ref 11.7–15.4)
WHITE BLOOD CELL COUNT: 4.8 10*3/uL (ref 3.4–10.8)

## 2023-05-11 LAB — IRON & TIBC
IRON SATURATION: 18 % (ref 15–55)
IRON: 62 ug/dL (ref 27–159)
TOTAL IRON BINDING CAPACITY: 354 ug/dL (ref 250–450)
UNSATURATED IRON BINDING CAPACITY: 292 ug/dL (ref 131–425)

## 2023-05-11 LAB — RENAL FUNCTION PANEL
ALBUMIN: 4.5 g/dL (ref 4.0–5.0)
BLOOD UREA NITROGEN: 17 mg/dL (ref 6–20)
BUN / CREAT RATIO: 14 (ref 9–23)
CALCIUM: 9.5 mg/dL (ref 8.7–10.2)
CHLORIDE: 108 mmol/L — ABNORMAL HIGH (ref 96–106)
CO2: 17 mmol/L — ABNORMAL LOW (ref 20–29)
CREATININE: 1.24 mg/dL — ABNORMAL HIGH (ref 0.57–1.00)
EGFR: 64 mL/min/{1.73_m2}
GLUCOSE: 94 mg/dL (ref 70–99)
PHOSPHORUS, SERUM: 3.4 mg/dL (ref 3.3–5.1)
POTASSIUM: 4.4 mmol/L (ref 3.5–5.2)
SODIUM: 141 mmol/L (ref 134–144)

## 2023-05-12 MED FILL — MYCOPHENOLATE MOFETIL 200 MG/ML ORAL POWDER FOR SUSPENSION: ORAL | 34 days supply | Qty: 160 | Fill #3

## 2023-05-12 MED FILL — ORA-BLEND ORAL SUSPENSION, TACROLIMUS 5 MG CAPSULE, IMMEDIATE-RELEASE: ORAL | 30 days supply | Qty: 54 | Fill #2

## 2023-05-13 LAB — TACROLIMUS LEVEL: TACROLIMUS BLOOD: 7.1 ng/mL (ref 2.0–20.0)

## 2023-05-15 DIAGNOSIS — Z940 Kidney transplant status: Principal | ICD-10-CM

## 2023-05-15 DIAGNOSIS — D849 Immunodeficiency, unspecified: Principal | ICD-10-CM

## 2023-05-15 DIAGNOSIS — D631 Anemia in chronic kidney disease: Principal | ICD-10-CM

## 2023-05-15 DIAGNOSIS — N189 Chronic kidney disease, unspecified: Principal | ICD-10-CM

## 2023-05-15 NOTE — Unmapped (Signed)
Transplant Lab Follow-Up    Patient was contacted to discuss laboratory results. Tacrolimus level was within goal range at 7.1 ng/mL (goal 5-7 ng/mL). Plan to continue current tacrolimus (Prograf) dose of 0.9 mg every 12 hours. Patient will get repeat labs in 1 month. Plan to follow-up with other labwork as results made available. All questions answered. Patient verbalized understanding & agreed with the plan.    Laboratory Results Summary     Immunosuppression  Regimen: Tacrolimus 0.9 mg every 12 hours  Trough Goal: 5-7 ng/mL    Lab Results   Component Value Date    TACROLIMUS 7.1 05/10/2023    TACROLIMUS 5.8 04/06/2023    TACROLIMUS 5.4 03/06/2023     Infectious Disease/Viral Testing    Lab Results   Component Value Date    CMV Viral Ld Not Detected 03/06/2023     Lab Results   Component Value Date    CMV Quant Negative 04/06/2023     Other Laboratory Results  Lab Results   Component Value Date    BUN 17 05/10/2023    CREATININE 1.24 (H) 05/10/2023    K 4.4 05/10/2023    GLU 92 03/06/2023    MG 1.8 04/06/2023     Lab Results   Component Value Date    WBC 4.8 05/10/2023    HGB 10.4 (L) 05/10/2023    HCT 32.5 (L) 05/10/2023    PLT 230 05/10/2023    NEUTROABS 3.4 05/10/2023       Melina Modena, PharmD, BCPPS, CPP  Clinical Pharmacist Practitioner - Pediatric Solid Organ Transplant

## 2023-05-18 NOTE — Unmapped (Signed)
UNOS annual updated completed

## 2023-05-22 DIAGNOSIS — D849 Immunodeficiency, unspecified: Principal | ICD-10-CM

## 2023-05-22 DIAGNOSIS — D631 Anemia in chronic kidney disease: Principal | ICD-10-CM

## 2023-05-22 DIAGNOSIS — N189 Chronic kidney disease, unspecified: Principal | ICD-10-CM

## 2023-05-22 DIAGNOSIS — Z940 Kidney transplant status: Principal | ICD-10-CM

## 2023-05-29 DIAGNOSIS — D631 Anemia in chronic kidney disease: Principal | ICD-10-CM

## 2023-05-29 DIAGNOSIS — Z940 Kidney transplant status: Principal | ICD-10-CM

## 2023-05-29 DIAGNOSIS — N189 Chronic kidney disease, unspecified: Principal | ICD-10-CM

## 2023-05-29 DIAGNOSIS — D849 Immunodeficiency, unspecified: Principal | ICD-10-CM

## 2023-06-05 DIAGNOSIS — D849 Immunodeficiency, unspecified: Principal | ICD-10-CM

## 2023-06-05 DIAGNOSIS — N189 Chronic kidney disease, unspecified: Principal | ICD-10-CM

## 2023-06-05 DIAGNOSIS — D631 Anemia in chronic kidney disease: Principal | ICD-10-CM

## 2023-06-05 DIAGNOSIS — Z940 Kidney transplant status: Principal | ICD-10-CM

## 2023-06-06 DIAGNOSIS — Z940 Kidney transplant status: Principal | ICD-10-CM

## 2023-06-06 MED ORDER — MYCOPHENOLATE MOFETIL 200 MG/ML ORAL POWDER FOR SUSPENSION
Freq: Two times a day (BID) | ORAL | 11 refills | 35.00 days
Start: 2023-06-06 — End: 2024-06-05

## 2023-06-06 NOTE — Unmapped (Signed)
[facility] Specialty and Home Delivery Pharmacy Refill Coordination Note    Specialty Medication(s) to be Shipped:   Transplant: tacrolimus oral suspension 1mg /ml and mycophenolate susp 200mg /ml    Other medication(s) to be shipped: No additional medications requested for fill at this time     Michelle [name], DOB: [birth year]  Phone: [phone] (home)       All above HIPAA information was verified with patient.     Was a Nurse, learning disability used for this call?  Denied interpreter    Completed refill call assessment today to schedule patient's medication shipment from the [facility] Specialty and Home Delivery Pharmacy  [phone]).  All relevant notes have been reviewed.     Specialty medication(s) and dose(s) confirmed: Regimen is correct and unchanged.   Changes to medications: Michelle [name] reports no changes at this time.  Changes to insurance: No  New side effects reported not previously addressed with a pharmacist or physician: None reported  Questions for the pharmacist: No    Confirmed patient received a Conservation officer, historic buildings and a Surveyor, mining with first shipment. The patient will receive a drug information handout for each medication shipped and additional FDA Medication Guides as required.       DISEASE/MEDICATION-SPECIFIC INFORMATION        N/A    SPECIALTY MEDICATION ADHERENCE     Medication Adherence    Patient reported X missed doses in the last month: 0  Specialty Medication: mycophenolate 200 mg/mL suspension  Patient is on additional specialty medications: Yes  Additional Specialty Medications: tacrolimus 1mg /ml  Patient Reported Additional Medication X Missed Doses in the Last Month: 0              Were doses missed due to medication being on hold? No    tacrolimus 1mg /ml  : 3 days of medicine on hand   Mycophenolate 200mg /ml susp  : 3 days of medicine on hand       REFERRAL TO PHARMACIST     Referral to the pharmacist: Not needed      [facility]     Shipping address confirmed in Epic.       Delivery Scheduled: Yes, Expected medication delivery date: 1/10.  However, Rx request for refills was sent to the provider as there are none remaining.     Medication will be delivered via Same Day Courier to the prescription address in Epic WAM.    Michelle [name]   [facility] and Home Delivery Pharm[facility]  Specialty Technician

## 2023-06-07 LAB — CBC W/ DIFFERENTIAL
BANDED NEUTROPHILS ABSOLUTE COUNT: 0 10*3/uL (ref 0.0–0.1)
BASOPHILS ABSOLUTE COUNT: 0 10*3/uL (ref 0.0–0.2)
BASOPHILS RELATIVE PERCENT: 0 %
EOSINOPHILS ABSOLUTE COUNT: 0.1 10*3/uL (ref 0.0–0.4)
EOSINOPHILS RELATIVE PERCENT: 2 %
HEMATOCRIT: 32.1 % — ABNORMAL LOW (ref 34.0–46.6)
HEMOGLOBIN: 10.7 g/dL — ABNORMAL LOW (ref 11.1–15.9)
IMMATURE GRANULOCYTES: 0 %
LYMPHOCYTES ABSOLUTE COUNT: 1.1 10*3/uL (ref 0.7–3.1)
LYMPHOCYTES RELATIVE PERCENT: 23 %
MEAN CORPUSCULAR HEMOGLOBIN CONC: 33.3 g/dL (ref 31.5–35.7)
MEAN CORPUSCULAR HEMOGLOBIN: 27.7 pg (ref 26.6–33.0)
MEAN CORPUSCULAR VOLUME: 83 fL (ref 79–97)
MONOCYTES ABSOLUTE COUNT: 0.3 10*3/uL (ref 0.1–0.9)
MONOCYTES RELATIVE PERCENT: 7 %
NEUTROPHILS ABSOLUTE COUNT: 3.1 10*3/uL (ref 1.4–7.0)
NEUTROPHILS RELATIVE PERCENT: 68 %
PLATELET COUNT: 243 10*3/uL (ref 150–450)
RED BLOOD CELL COUNT: 3.86 x10E6/uL (ref 3.77–5.28)
RED CELL DISTRIBUTION WIDTH: 13 % (ref 11.7–15.4)
WHITE BLOOD CELL COUNT: 4.6 10*3/uL (ref 3.4–10.8)

## 2023-06-07 LAB — RENAL FUNCTION PANEL
ALBUMIN: 4.3 g/dL (ref 4.0–5.0)
BLOOD UREA NITROGEN: 20 mg/dL (ref 6–20)
BUN / CREAT RATIO: 16 (ref 9–23)
CALCIUM: 9.2 mg/dL (ref 8.7–10.2)
CHLORIDE: 107 mmol/L — ABNORMAL HIGH (ref 96–106)
CO2: 21 mmol/L (ref 20–29)
CREATININE: 1.27 mg/dL — ABNORMAL HIGH (ref 0.57–1.00)
EGFR: 62 mL/min/{1.73_m2}
GLUCOSE: 94 mg/dL (ref 70–99)
PHOSPHORUS, SERUM: 3.6 mg/dL (ref 3.3–5.1)
POTASSIUM: 4.2 mmol/L (ref 3.5–5.2)
SODIUM: 141 mmol/L (ref 134–144)

## 2023-06-07 LAB — CMV DNA, QUANTITATIVE, PCR: CMV QUANT: NEGATIVE [IU]/mL

## 2023-06-07 MED ORDER — MYCOPHENOLATE MOFETIL 200 MG/ML ORAL POWDER FOR SUSPENSION
Freq: Two times a day (BID) | ORAL | 11 refills | 35.00 days | Status: CP
Start: 2023-06-07 — End: 2024-06-06

## 2023-06-08 LAB — TACROLIMUS LEVEL: TACROLIMUS BLOOD: 6.1 ng/mL (ref 2.0–20.0)

## 2023-06-08 NOTE — Unmapped (Signed)
Transplant Lab Follow-Up    Patient was called to discuss laboratory results. Tacrolimus level was within goal range at 6.1 ng/mL (goal 5-7 ng/mL). Plan to continue current tacrolimus (Prograf) dose of 0.9 mg every 12 hours. Patient will get repeat labs in 1 month. Plan to follow-up with other labwork as results made available. All questions answered. Patient verbalized understanding & agreed with the plan.     Laboratory Results Summary     Immunosuppression  Administration Times: 0800/2000  Current Regimen: Tacrolimus 0.9 mg every 12 hours  NEW Regimen: Tacrolimus 0.9 mg every 12 hours  Trough Goal: 5-7 ng/mL    Lab Results   Component Value Date    TACROLIMUS 6.1 06/06/2023    TACROLIMUS 7.1 05/10/2023    TACROLIMUS 5.8 04/06/2023     Infectious Disease/Viral Testing    Lab Results   Component Value Date    CMV Viral Ld Not Detected 03/06/2023     Lab Results   Component Value Date    CMV Quant Negative 06/06/2023     Other Laboratory Results  Lab Results   Component Value Date    BUN 20 06/06/2023    CREATININE 1.27 (H) 06/06/2023    K 4.2 06/06/2023    GLU 92 03/06/2023    MG 1.8 04/06/2023     Lab Results   Component Value Date    WBC 4.6 06/06/2023    HGB 10.7 (L) 06/06/2023    HCT 32.1 (L) 06/06/2023    PLT 243 06/06/2023    NEUTROABS 3.1 06/06/2023       Melina Modena, PharmD, BCPPS, CPP  Clinical Pharmacist Practitioner - Pediatric Solid Organ Transplant

## 2023-06-10 DIAGNOSIS — Z940 Kidney transplant status: Principal | ICD-10-CM

## 2023-06-10 MED ORDER — MYCOPHENOLATE MOFETIL 500 MG TABLET
ORAL_TABLET | Freq: Two times a day (BID) | ORAL | 0 refills | 4.00 days | Status: CP
Start: 2023-06-10 — End: 2023-06-14
  Filled 2023-06-13: qty 160, 34d supply, fill #0

## 2023-06-10 MED ORDER — TACROLIMUS 1 MG CAPSULE, IMMEDIATE-RELEASE
ORAL_CAPSULE | Freq: Two times a day (BID) | ORAL | 0 refills | 4.00 days | Status: CP
Start: 2023-06-10 — End: 2023-06-14
  Filled 2023-06-13: qty 54, 30d supply, fill #3

## 2023-06-11 DIAGNOSIS — Z940 Kidney transplant status: Principal | ICD-10-CM

## 2023-06-11 MED ORDER — MYCOPHENOLATE MOFETIL 500 MG TABLET
ORAL_TABLET | 0 refills | 0.00 days
Start: 2023-06-11 — End: ?

## 2023-06-11 NOTE — Unmapped (Signed)
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 Sent 4 day prescription to [facility].

## 2023-06-12 DIAGNOSIS — D849 Immunodeficiency, unspecified: Principal | ICD-10-CM

## 2023-06-12 DIAGNOSIS — Z940 Kidney transplant status: Principal | ICD-10-CM

## 2023-06-12 DIAGNOSIS — D631 Anemia in chronic kidney disease: Principal | ICD-10-CM

## 2023-06-12 DIAGNOSIS — N189 Chronic kidney disease, unspecified: Principal | ICD-10-CM

## 2023-06-12 MED ORDER — MYCOPHENOLATE MOFETIL 500 MG TABLET
ORAL_TABLET | 0 refills | 0.00 days
Start: 2023-06-12 — End: ?

## 2023-06-12 NOTE — Unmapped (Signed)
Spoke with patient's mom. Delivery now scheduled for same day on 1/14. All other information is the same as previous note in this encounter.

## 2023-06-18 DIAGNOSIS — Z940 Kidney transplant status: Principal | ICD-10-CM

## 2023-06-18 MED ORDER — TACROLIMUS 1 MG CAPSULE, IMMEDIATE-RELEASE
ORAL_CAPSULE | 0 refills | 0.00 days
Start: 2023-06-18 — End: ?

## 2023-06-19 DIAGNOSIS — D631 Anemia in chronic kidney disease: Principal | ICD-10-CM

## 2023-06-19 DIAGNOSIS — N189 Chronic kidney disease, unspecified: Principal | ICD-10-CM

## 2023-06-19 DIAGNOSIS — Z940 Kidney transplant status: Principal | ICD-10-CM

## 2023-06-19 DIAGNOSIS — D849 Immunodeficiency, unspecified: Principal | ICD-10-CM

## 2023-06-19 MED ORDER — TACROLIMUS 1 MG CAPSULE, IMMEDIATE-RELEASE
ORAL_CAPSULE | 0 refills | 0.00 days
Start: 2023-06-19 — End: ?

## 2023-06-20 DIAGNOSIS — Z940 Kidney transplant status: Principal | ICD-10-CM

## 2023-06-20 MED ORDER — TACROLIMUS 1 MG CAPSULE, IMMEDIATE-RELEASE
ORAL_CAPSULE | 0 refills | 0.00 days
Start: 2023-06-20 — End: ?

## 2023-06-21 DIAGNOSIS — Z940 Kidney transplant status: Principal | ICD-10-CM

## 2023-06-21 MED ORDER — TACROLIMUS 1 MG CAPSULE, IMMEDIATE-RELEASE
ORAL_CAPSULE | 0 refills | 0.00 days
Start: 2023-06-21 — End: ?

## 2023-06-22 DIAGNOSIS — Z940 Kidney transplant status: Principal | ICD-10-CM

## 2023-06-22 MED ORDER — TACROLIMUS 1 MG CAPSULE, IMMEDIATE-RELEASE
ORAL_CAPSULE | 0 refills | 0.00 days
Start: 2023-06-22 — End: ?

## 2023-06-23 DIAGNOSIS — Z940 Kidney transplant status: Principal | ICD-10-CM

## 2023-06-23 MED ORDER — TACROLIMUS 1 MG CAPSULE, IMMEDIATE-RELEASE
ORAL_CAPSULE | 0 refills | 0.00 days
Start: 2023-06-23 — End: ?

## 2023-06-26 DIAGNOSIS — D849 Immunodeficiency, unspecified: Principal | ICD-10-CM

## 2023-06-26 DIAGNOSIS — N189 Chronic kidney disease, unspecified: Principal | ICD-10-CM

## 2023-06-26 DIAGNOSIS — D631 Anemia in chronic kidney disease: Principal | ICD-10-CM

## 2023-06-26 DIAGNOSIS — Z940 Kidney transplant status: Principal | ICD-10-CM

## 2023-07-03 DIAGNOSIS — Z940 Kidney transplant status: Principal | ICD-10-CM

## 2023-07-03 DIAGNOSIS — D849 Immunodeficiency, unspecified: Principal | ICD-10-CM

## 2023-07-03 DIAGNOSIS — N189 Chronic kidney disease, unspecified: Principal | ICD-10-CM

## 2023-07-03 DIAGNOSIS — D631 Anemia in chronic kidney disease: Principal | ICD-10-CM

## 2023-07-05 LAB — CBC W/ DIFFERENTIAL
BANDED NEUTROPHILS ABSOLUTE COUNT: 0 10*3/uL (ref 0.0–0.1)
BASOPHILS ABSOLUTE COUNT: 0 10*3/uL (ref 0.0–0.2)
BASOPHILS RELATIVE PERCENT: 1 %
EOSINOPHILS ABSOLUTE COUNT: 0.1 10*3/uL (ref 0.0–0.4)
EOSINOPHILS RELATIVE PERCENT: 2 %
HEMATOCRIT: 32.1 % — ABNORMAL LOW (ref 34.0–46.6)
HEMOGLOBIN: 10.3 g/dL — ABNORMAL LOW (ref 11.1–15.9)
IMMATURE GRANULOCYTES: 0 %
LYMPHOCYTES ABSOLUTE COUNT: 1 10*3/uL (ref 0.7–3.1)
LYMPHOCYTES RELATIVE PERCENT: 24 %
MEAN CORPUSCULAR HEMOGLOBIN CONC: 32.1 g/dL (ref 31.5–35.7)
MEAN CORPUSCULAR HEMOGLOBIN: 26.5 pg — ABNORMAL LOW (ref 26.6–33.0)
MEAN CORPUSCULAR VOLUME: 83 fL (ref 79–97)
MONOCYTES ABSOLUTE COUNT: 0.3 10*3/uL (ref 0.1–0.9)
MONOCYTES RELATIVE PERCENT: 7 %
NEUTROPHILS ABSOLUTE COUNT: 2.7 10*3/uL (ref 1.4–7.0)
NEUTROPHILS RELATIVE PERCENT: 66 %
PLATELET COUNT: 252 10*3/uL (ref 150–450)
RED BLOOD CELL COUNT: 3.89 x10E6/uL (ref 3.77–5.28)
RED CELL DISTRIBUTION WIDTH: 13 % (ref 11.7–15.4)
WHITE BLOOD CELL COUNT: 4.1 10*3/uL (ref 3.4–10.8)

## 2023-07-05 LAB — RENAL FUNCTION PANEL
ALBUMIN: 4.4 g/dL (ref 4.0–5.0)
BLOOD UREA NITROGEN: 15 mg/dL (ref 6–20)
BUN / CREAT RATIO: 11 (ref 9–23)
CALCIUM: 9.6 mg/dL (ref 8.7–10.2)
CHLORIDE: 105 mmol/L (ref 96–106)
CO2: 20 mmol/L (ref 20–29)
CREATININE: 1.4 mg/dL — ABNORMAL HIGH (ref 0.57–1.00)
EGFR: 56 mL/min/{1.73_m2} — ABNORMAL LOW
GLUCOSE: 95 mg/dL (ref 70–99)
PHOSPHORUS, SERUM: 3.6 mg/dL (ref 3.3–5.1)
POTASSIUM: 4.2 mmol/L (ref 3.5–5.2)
SODIUM: 140 mmol/L (ref 134–144)

## 2023-07-05 NOTE — Unmapped (Signed)
[facility] Specialty and Home Delivery Pharmacy Clinical Assessment & Refill Coordination Note    Michelle [name], DOB: 2005-12-25  Phone: [phone] (home)     All above HIPAA information was verified with patient.     Was a Nurse, learning disability used for this call? No    Specialty Medication(s):   Transplant: tacrolimus oral suspension 1mg /ml and mycophenolate 200mg /ml     Current Outpatient Medications   Medication Sig Dispense Refill    carvedilol (COREG) 25 MG tablet Take 1 tablet (25 mg total) by mouth Two (2) times a day. 60 tablet 11    mycophenolate (CELLCEPT) 200 mg/mL suspension Take 2.3 mL (460 mg total) by mouth two (2) times a day. 160 mL 11    mycophenolate (CELLCEPT) 500 mg tablet Take 1 tablet (500 mg total) by mouth two (2) times a day for 4 days. Medication did not arrive from patient's mail order pharmacy on promised date, patient has a kidney transplant and cannot be without her meds, please ask insurance to pay for lost pills or pills that did not come from pharmacy to prevent rejection 8 tablet 0    tacrolimus 1 mg/mL oral suspension (CAPS) Take 0.9 mL (0.9 mg total) by mouth two (2) times a day. 54 mL 11     No current facility-administered medications for this visit.        Changes to medications: Michelle [name] reports no changes at this time.    Medication list has been reviewed and updated in Epic: Yes    Allergies   Allergen Reactions    Latex      Added based on information entered during case entry, please review and add reactions, type, and severity as needed       Changes to allergies: No    Allergies have been reviewed and updated in Epic: Yes    SPECIALTY MEDICATION ADHERENCE     Tacrolimus 1 mg/ml: 14 days of medicine on hand   Mycophenolate 200 mg/ml: 14 days of medicine on hand     Medication Adherence    Patient reported X missed doses in the last month: 0  Specialty Medication: Tacrolimus 1mg /ml  Patient is on additional specialty medications: Yes  Additional Specialty Medications: Mycophenolate 200mg /ml  Patient Reported Additional Medication X Missed Doses in the Last Month: 0  Patient is on more than two specialty medications: No          Specialty medication(s) dose(s) confirmed: Regimen is correct and unchanged.     Are there any concerns with adherence? No    Adherence counseling provided? Not needed    CLINICAL MANAGEMENT AND INTERVENTION      Clinical Benefit Assessment:    Do you feel the medicine is effective or helping your condition? Yes    Clinical Benefit counseling provided? Not needed    Adverse Effects Assessment:    Are you experiencing any side effects? No    Are you experiencing difficulty administering your medicine? No    Quality of Life Assessment:    Quality of Life    Rheumatology  Oncology  Dermatology  Cystic Fibrosis          How many days over the past month did your kidney transplant  keep you from your normal activities? For example, brushing your teeth or getting up in the morning. 0    Have you discussed this with your provider? Not needed    Acute Infection Status:    Acute infections noted within Epic:  No active infections  Patient reported infection: None    Therapy Appropriateness:    Is therapy appropriate based on current medication list, adverse reactions, adherence, clinical benefit and progress toward achieving therapeutic goals? Yes, therapy is appropriate and should be continued     DISEASE/MEDICATION-SPECIFIC INFORMATION      N/A    Solid Organ Transplant: Not Applicable    PATIENT SPECIFIC NEEDS     Does the patient have any physical, cognitive, or cultural barriers? No    Is the patient high risk? Yes, patient is taking a REMS drug. Medication is dispensed in compliance with REMS program    Did the patient require a clinical intervention? No    Does the patient require physician intervention or other additional services (i.e., nutrition, smoking cessation, social work)? No    Does the patient have an additional or emergency contact listed in their chart? Yes    SOCIAL DETERMINANTS OF HEALTH     At the [facility], we have learned that life circumstances - like trouble affording food, housing, utilities, or transportation can affect the health of many of our patients.   That is why we wanted to ask: are you currently experiencing any life circumstances that are negatively impacting your health and/or quality of life? Patient declined to answer    Social Drivers of Health     Food Insecurity: No Food Insecurity (04/14/2021)    Hunger Vital Sign     Worried About Running Out of Food in the Last Year: Never true     Ran Out of Food in the Last Year: Never true   Internet Connectivity: Not on file   Housing/Utilities: Low Risk  (04/14/2021)    Housing/Utilities     Within the past 12 months, have you ever stayed: outside, in a car, in a tent, in an overnight shelter, or temporarily in someone else's home (i.e. couch-surfing)?: No     Are you worried about losing your housing?: No     Within the past 12 months, have you been unable to get utilities (heat, electricity) when it was really needed?: No   Tobacco Use: Low Risk  (04/10/2023)    Patient History     Smoking Tobacco Use: Never     Smokeless Tobacco Use: Never     Passive Exposure: Never   Transportation Needs: No Transportation Needs (04/14/2021)    PRAPARE - Transportation     Lack of Transportation (Medical): No     Lack of Transportation (Non-Medical): No   Alcohol Use: Not At Risk (06/17/2021)    Alcohol Use     How often do you have a drink containing alcohol?: Never     How many drinks containing alcohol do you have on a typical day when you are drinking?: Not on file     How often do you have 5 or more drinks on one occasion?: Never   Interpersonal Safety: Not on file   Physical Activity: Not on file   Intimate Partner Violence: Not on file   Stress: Not on file   Substance Use: Not on file (04/06/2023)   Social Connections: Not on file   Financial Resource Strain: Low Risk  (04/14/2021)    Overall Financial Resource Strain (CARDIA)     Difficulty of Paying Living Expenses: Not very hard   Depression: Not on file   Health Literacy: Not on file       Would you be willing to receive help with any  of the needs that you have identified today? Not applicable       SHIPPING     Specialty Medication(s) to be Shipped:   Transplant: tacrolimus oral suspension 1mg /ml and mycophenolate 200mg /ml    Other medication(s) to be shipped: No additional medications requested for fill at this time     Changes to insurance: No    Delivery Scheduled: Yes, Expected medication delivery date: 07/13/23.     Medication will be delivered via Same Day Courier to the confirmed prescription address in [facility].    The patient will receive a drug information handout for each medication shipped and additional FDA Medication Guides as required.  Verified that patient has previously received a Conservation officer, historic buildings and a Surveyor, mining.    The patient or caregiver noted above participated in the development of this care plan and knows that they can request review of or adjustments to the care plan at any time.      All of the patient's questions and concerns have been addressed.    Tera Helper, [facility]   [facility] Specialty and Home Delivery Pharmacy Specialty Pharmacist

## 2023-07-06 LAB — TACROLIMUS LEVEL: TACROLIMUS BLOOD: 7.9 ng/mL (ref 5.0–20.0)

## 2023-07-06 NOTE — Unmapped (Signed)
Transplant Lab Follow-Up    Patient was called to discuss laboratory results. Tacrolimus level was within goal range at 7.9 ng/mL (goal 5-7 ng/mL). Plan to continue current tacrolimus (Prograf) dose of 0.9 mg every 12 hours. Patient will get repeat labs in 1 month. Plan to follow-up with other labwork as results made available. All questions answered. Patient verbalized understanding & agreed with the plan.     Laboratory Results Summary     Immunosuppression  Administration Times: 0800/2000  Current Regimen: Tacrolimus 0.9 mg every 12 hours  NEW Regimen: Tacrolimus 0.9 mg every 12 hours  Trough Goal: 5-7 ng/mL    Lab Results   Component Value Date    TACROLIMUS 7.9 07/04/2023    TACROLIMUS 6.1 06/06/2023    TACROLIMUS 7.1 05/10/2023     Infectious Disease/Viral Testing    Lab Results   Component Value Date    CMV Viral Ld Not Detected 03/06/2023     Lab Results   Component Value Date    CMV Quant Negative 06/06/2023     Other Laboratory Results  Lab Results   Component Value Date    BUN 15 07/04/2023    CREATININE 1.40 (H) 07/04/2023    K 4.2 07/04/2023    GLU 92 03/06/2023    MG 1.8 04/06/2023     Lab Results   Component Value Date    WBC 4.1 07/04/2023    HGB 10.3 (L) 07/04/2023    HCT 32.1 (L) 07/04/2023    PLT 252 07/04/2023    NEUTROABS 2.7 07/04/2023       Melina Modena, PharmD, BCPPS, CPP  Clinical Pharmacist Practitioner - Pediatric Solid Organ Transplant

## 2023-07-10 DIAGNOSIS — D631 Anemia in chronic kidney disease: Principal | ICD-10-CM

## 2023-07-10 DIAGNOSIS — Z940 Kidney transplant status: Principal | ICD-10-CM

## 2023-07-10 DIAGNOSIS — N189 Chronic kidney disease, unspecified: Principal | ICD-10-CM

## 2023-07-10 DIAGNOSIS — D849 Immunodeficiency, unspecified: Principal | ICD-10-CM

## 2023-07-13 MED FILL — MYCOPHENOLATE MOFETIL 200 MG/ML ORAL POWDER FOR SUSPENSION: ORAL | 34 days supply | Qty: 160 | Fill #1

## 2023-07-13 MED FILL — ORA-BLEND ORAL SUSPENSION, TACROLIMUS 5 MG CAPSULE, IMMEDIATE-RELEASE: ORAL | 30 days supply | Qty: 54 | Fill #4

## 2023-07-17 DIAGNOSIS — D631 Anemia in chronic kidney disease: Principal | ICD-10-CM

## 2023-07-17 DIAGNOSIS — N189 Chronic kidney disease, unspecified: Principal | ICD-10-CM

## 2023-07-17 DIAGNOSIS — Z940 Kidney transplant status: Principal | ICD-10-CM

## 2023-07-17 DIAGNOSIS — D849 Immunodeficiency, unspecified: Principal | ICD-10-CM

## 2023-07-18 ENCOUNTER — Encounter: Admit: 2023-07-18 | Discharge: 2023-07-19 | Payer: PRIVATE HEALTH INSURANCE

## 2023-07-18 NOTE — Unmapped (Signed)
 Today we completed a kidney transplant pre-transition visit. We provided 42 minutes of age-appropriate counseling for health prevention and risk factor reduction as it pertains to the adolescent-to-young-adult kidney transplant recipient. Physician and transplant nurse coordinator were both present for the duration of the visit.     Primary nephrologist: Dr. Roel    Medication adherence habits:  Takes pills from pill containers, feels confident in this method    Insurance carrier and issues:   Medicaid    Mental health screen:  PHQ9a: 0 (normal is 0-4)  GAD7: 0 (normal is 0-4)    Strengths:  Excellent self management and consistency    Opportunities for improvement:  None, she is doing great.    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 Works in a Psychiatrist.

## 2023-07-19 DIAGNOSIS — Z940 Kidney transplant status: Principal | ICD-10-CM

## 2023-07-19 MED ORDER — MYHIBBIN 200 MG/ML ORAL SUSPENSION
Freq: Two times a day (BID) | ORAL | 11 refills | 0.00 days | Status: CP
Start: 2023-07-19 — End: ?
  Filled 2023-08-07: qty 175, 34d supply, fill #0

## 2023-07-20 DIAGNOSIS — Z940 Kidney transplant status: Principal | ICD-10-CM

## 2023-07-20 MED ORDER — MYHIBBIN 200 MG/ML ORAL SUSPENSION
Freq: Two times a day (BID) | ORAL | 11 refills | 0.00 days | Status: CP
Start: 2023-07-20 — End: ?

## 2023-07-20 NOTE — Unmapped (Signed)
 [facility] Specialty Medication Onboarding    Specialty Medication: Myhibbin 200mg /mL suspension  Prior Authorization: Not Required   Financial Assistance: No - copay  <$25  Final Copay/Day Supply: $0 / 34 days    Insurance Restrictions: Yes - max 1 month supply     Notes to Pharmacist: N/A  Credit Card on File: no  Start Date on Rx:  N/A    The triage team has completed the benefits investigation and has determined that the patient is able to fill this medication at [facility]. Please contact the patient to complete the onboarding or follow up with the prescribing physician as needed.

## 2023-07-24 ENCOUNTER — Ambulatory Visit: Admit: 2023-07-24 | Discharge: 2023-07-25 | Payer: PRIVATE HEALTH INSURANCE

## 2023-07-24 ENCOUNTER — Ambulatory Visit
Admit: 2023-07-24 | Discharge: 2023-07-25 | Payer: PRIVATE HEALTH INSURANCE | Attending: Pediatric Nephrology | Primary: Pediatric Nephrology

## 2023-07-24 ENCOUNTER — Inpatient Hospital Stay: Admit: 2023-07-24 | Discharge: 2023-07-25 | Payer: PRIVATE HEALTH INSURANCE

## 2023-07-24 DIAGNOSIS — N189 Chronic kidney disease, unspecified: Principal | ICD-10-CM

## 2023-07-24 DIAGNOSIS — N182 Chronic kidney disease, stage 2 (mild): Principal | ICD-10-CM

## 2023-07-24 DIAGNOSIS — D849 Immunodeficiency, unspecified: Principal | ICD-10-CM

## 2023-07-24 DIAGNOSIS — Z940 Kidney transplant status: Principal | ICD-10-CM

## 2023-07-24 DIAGNOSIS — D631 Anemia in chronic kidney disease: Principal | ICD-10-CM

## 2023-07-24 LAB — HEMOGLOBIN A1C
ESTIMATED AVERAGE GLUCOSE: 105 mg/dL
HEMOGLOBIN A1C: 5.3 % (ref 4.8–5.6)

## 2023-07-24 LAB — HCG QUANTITATIVE, BLOOD: GONADOTROPIN, CHORIONIC (HCG) QUANT: <2.6 m[IU]/mL

## 2023-07-24 LAB — AST: AST (SGOT): 36 U/L — ABNORMAL HIGH (ref <=34)

## 2023-07-24 LAB — LIPID PANEL
CHOLESTEROL/HDL RATIO SCREEN: 2.4 (ref 1.0–4.5)
CHOLESTEROL: 166 mg/dL (ref <=200)
HDL CHOLESTEROL: 69 mg/dL — ABNORMAL HIGH (ref 40–60)
LDL CHOLESTEROL CALCULATED: 73 mg/dL (ref 40–99)
NON-HDL CHOLESTEROL: 97 mg/dL (ref 70–130)
TRIGLYCERIDES: 122 mg/dL (ref 0–150)
VLDL CHOLESTEROL CAL: 24.4 mg/dL (ref 8–29)

## 2023-07-24 LAB — ALT: ALT (SGPT): 32 U/L (ref 10–49)

## 2023-07-24 NOTE — Unmapped (Signed)
 Addended by: Herminio Heads on: 07/24/2023 05:27 PM     Modules accepted: Orders

## 2023-07-24 NOTE — Unmapped (Signed)
 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 Allosure was great on 12/09/21 at 0.28.   Creatinine 07/04/23: 1.40 (range 1-1.5).   Urinalysis 04/10/23: 1+ blood, always has a little bit of blood.   DSA: None, last checked 03/06/23  Allosure:   0.21 11/07/22  0.24 03/06/23     # Immunosuppression:   - Prograf level 07/04/23 7.9, goal 5-7  - MMF 460mg  BID, AUC done 4 weeks post transplant  - Changes in Immunosuppression: none  - Medications side effects: none     # HTN - not well controlled  - B.P - 127/83 goal is 110/65 (50th percentile).   - Continue carvedilol 25mg  BID. No changes. Will watch.      # Anemia - at goal  Goal for Hemoglobin >10, currently at goal  Iron sat was 15% in Oct 2024, she prefers not to add a medication     # Infectious disease  EBV D+/R+, CMV D+/R+  Pending from today  All three viruses negative 03/06/23     # MBD - Calcium/Phosphorus   PTH 06/15/21: 69.1 no need to keep checking until GFR drops.      # Electrolytes:   No issues.      # Immunizations:   Immunization History   Administered Date(s) Administered    COVID-19 VAC,BIVALENT(22YR UP),PFIZER 04/15/2021    COVID-19 VACC,MRNA,(PFIZER)(PF) 01/16/2020, 02/06/2020    Covid-19 Vac, (70yr+) (Comirnaty) Mrna Pfizer  04/10/2023    DTaP / IPV 01/16/2008    DTaP, Unspecified Formulation 02/17/2004, 06/21/2004, 08/02/2004, 04/19/2005    HPV Quadrivalent (Gardasil) 01/09/2015, 03/27/2015, 07/24/2015    Hepatitis A Vaccine - Unspecified Formulation 01/17/2005, 07/25/2005    Hepatitis B Vaccine, Unspecified Formulation 08/19/03, 02/17/2004, 08/02/2004    Hepatitis B vaccine, pediatric/adolescent dosage, 04/15/2021    HiB, unspecified 02/17/2004, 06/21/2004, 08/02/2004    HiB-PRP-OMP 04/19/2005    Influenza Vaccine Quad(IM)6 MO-Adult(PF) 04/09/2021    MENINGOCOCCAL VACCINE, A,C,Y, W-135(IM)(MENVEO) 10/10/2020    MMR 01/17/2005, 01/16/2008    Meningococcal B Vaccine, OMV Adjuvanted(Bexsero) 10/10/2020    Meningococcal Conjugate MCV4P 01/09/2015    Pneumococcal Conjugate 13-Valent 04/15/2021    Pneumococcal conjugate -PCV7 02/17/2004, 06/21/2004, 08/02/2004, 01/17/2005    Polio Virus Vaccine, Unspecified Formulation 02/17/2004, 06/21/2004, 04/19/2005    TdaP 01/09/2015    Varicella 01/17/2005, 01/16/2008       Discharge Medications:     Current Outpatient Medications   Medication Sig Dispense Refill    carvedilol (COREG) 25 MG tablet Take 1 tablet (25 mg total) by mouth Two (2) times a day. 60 tablet 11    mycophenolate mofetil (MYHIBBIN) 200 mg/mL Susp Take 2.3 mL by mouth two (2) times a day. 175 mL 11    tacrolimus 1 mg/mL oral suspension (CAPS) Take 0.9 mL (0.9 mg total) by mouth two (2) times  a day. 54 mL 11     No current facility-administered medications for this visit.     I personally spent 35 minutes face-to-face and non-face-to-face in the care of this patient, which includes all pre, intra, and post visit time on the date of service.    Subjective:     Michelle [name] is a 18 y.o. 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      Biopsy 06/17/21 for high creatinine when it was 1.6-1.8 without explanation showed no rejection, maybe some calcineurin inhibitor toxicity, acute tubular injury.      Allosure 12/09/21 when creatinine was 1.7 was reassuring at 0.28.      Still working in Chief Strategy Officer at [employer]. Lives at home with her parents.      Review of Systems: ten systems reviewed and negative but for that noted in HPI      Medications:     Current Outpatient Medications   Medication Sig Dispense Refill    carvedilol (COREG) 25 MG tablet Take 1 tablet (25 mg total) by mouth Two (2) times a day. 60 tablet 11    mycophenolate mofetil (MYHIBBIN) 200 mg/mL Susp Take 2.3 mL by mouth two (2) times a day. 175 mL 11    tacrolimus 1 mg/mL oral suspension (CAPS) Take 0.9 mL (0.9 mg total) by mouth two (2) times a day. 54 mL 11     No current facility-administered medications for this visit.       Allergies:     Allergies   Allergen Reactions    Latex      Added based on information entered during case entry, please review and add reactions, type, and severity as needed       Past Medical History:     Past Medical History:   Diagnosis Date    Hyperkalemia 12/17/2020    Hypertension 12/11/2020    Secondary hyperparathyroidism (CMS-HCC) 12/17/2020       Objective:     PE:   BP 127/83 (BP Site: L Arm, BP Position: Sitting, BP Cuff Size: Medium)  - Pulse 73  - Temp 36.3 ??C (97.4 ??F) (Temporal)  - Ht 162.6 cm (5' 4)  - Wt 73.6 kg (162 lb 3.2 oz)  - LMP 07/21/2023 (Approximate)  - BMI 27.84 kg/m??   89 %ile (Z= 1.21) based on CDC (Girls, 2-20 Years) weight-for-age data using data from 07/24/2023.  45 %ile (Z= -0.12) based on CDC (Girls, 2-20 Years) Stature-for-age data based on Stature recorded on 07/24/2023.  Blood pressure %iles are not available for patients who are 18 years or older.  90 %ile (Z= 1.26) based on CDC (Girls, 2-20 Years) BMI-for-age based on BMI available on 07/24/2023.    General Appearance:  Healthy-appearing, well nourished, alert, interactive  HEENT: Sclerae white, EOMI, moist mucous membranes  Pulm: Normal RR and WOB   Renal:  Extremities without edema  Neuro: Alert; normal tone throughout        Labs:   Recent Results (from the past 4 weeks)   Renal Function Panel    Collection Time: 07/04/23  8:17 AM   Result Value Ref Range    Glucose 95 70 - 99 mg/dL    BUN 15 6 - 20 mg/dL    Creatinine 1.61 (H) 0.57 -  1.00 mg/dL    eGFR 56 (L) >16 XW/RUE/4.54    BUN/Creatinine Ratio 11 9 - 23    Sodium 140 134 - 144 mmol/L    Potassium 4.2 3.5 - 5.2 mmol/L    Chloride 105 96 - 106 mmol/L    CO2 20 20 - 29 mmol/L    Calcium 9.6 8.7 - 10.2 mg/dL    Phosphorus, Serum 3.6 3.3 - 5.1 mg/dL    Albumin 4.4 4.0 - 5.0 g/dL   CBC w/ Differential    Collection Time: 07/04/23  8:17 AM   Result Value Ref Range    WBC 4.1 3.4 - 10.8 x10E3/uL    RBC 3.89 3.77 - 5.28 x10E6/uL    HGB 10.3 (L) 11.1 - 15.9 g/dL    HCT 09.8 (L) 11.9 - 46.6 %    MCV 83 79 - 97 fL    MCH 26.5 (L) 26.6 - 33.0 pg    MCHC 32.1 31.5 - 35.7 g/dL    RDW 14.7 82.9 - 56.2 %    Platelet 252 150 - 450 x10E3/uL    Neutrophils % 66 Not Estab. %    Lymphocytes % 24 Not Estab. %    Monocytes % 7 Not Estab. %    Eosinophils % 2 Not Estab. %    Basophils % 1 Not Estab. %    Absolute Neutrophils 2.7 1.4 - 7.0 x10E3/uL    Absolute Lymphocytes 1.0 0.7 - 3.1 x10E3/uL    Absolute Monocytes  0.3 0.1 - 0.9 x10E3/uL    Absolute Eosinophils 0.1 0.0 - 0.4 x10E3/uL    Absolute Basophils  0.0 0.0 - 0.2 x10E3/uL    Immature Granulocytes 0 Not Estab. %    Bands Absolute 0.0 0.0 - 0.1 x10E3/uL   Tacrolimus level    Collection Time: 07/04/23  8:17 AM   Result Value Ref Range    Tacrolimus Lvl 7.9 5.0 - 20.0 ng/mL       --------------------------------------  Tereasa Coop (she/her)  PGY-3, [facility]

## 2023-07-25 LAB — EBV QUANTITATIVE PCR, BLOOD: EBV VIRAL LOAD RESULT: NOT DETECTED

## 2023-07-25 LAB — BK VIRUS QUANTITATIVE PCR, BLOOD: BK BLOOD RESULT: NOT DETECTED

## 2023-07-25 LAB — CMV DNA, QUANTITATIVE, PCR: CMV VIRAL LD: NOT DETECTED

## 2023-07-26 LAB — HLA DS POST TRANSPLANT
ANTI-DONOR DRW #1 MFI: 0 MFI
ANTI-DONOR HLA-A #1 MFI: 87 MFI
ANTI-DONOR HLA-A #2 MFI: 0 MFI
ANTI-DONOR HLA-B #1 MFI: 0 MFI
ANTI-DONOR HLA-B #2 MFI: 0 MFI
ANTI-DONOR HLA-C #1 MFI: 0 MFI
ANTI-DONOR HLA-DP #2 MFI: 5 MFI
ANTI-DONOR HLA-DQB #1 MFI: 0 MFI
ANTI-DONOR HLA-DQB #2 MFI: 2 MFI
ANTI-DONOR HLA-DR #1 MFI: 0 MFI
ANTI-DONOR HLA-DR #2 MFI: 128 MFI

## 2023-07-26 LAB — FSAB CLASS 2 ANTIBODY SPECIFICITY
CLASS 2 ANTIBODIES IDENTIFIED: 1:1 {titer}
HLA CL2 AB RESULT: POSITIVE

## 2023-07-26 LAB — FSAB CLASS 1 ANTIBODY SPECIFICITY: HLA CLASS 1 ANTIBODY RESULT: NEGATIVE

## 2023-07-26 LAB — VITAMIN D 25 HYDROXY: VITAMIN D, TOTAL (25OH): 25.4 ng/mL (ref 20.0–80.0)

## 2023-07-28 LAB — ALLOSURE KIDNEY: ALLOSURE: 0.22 %

## 2023-07-31 DIAGNOSIS — D849 Immunodeficiency, unspecified: Principal | ICD-10-CM

## 2023-07-31 DIAGNOSIS — Z940 Kidney transplant status: Principal | ICD-10-CM

## 2023-07-31 DIAGNOSIS — D631 Anemia in chronic kidney disease: Principal | ICD-10-CM

## 2023-07-31 DIAGNOSIS — N189 Chronic kidney disease, unspecified: Principal | ICD-10-CM

## 2023-08-01 NOTE — Unmapped (Signed)
 [name] is aware this is premixed suspension, kept at room temperature, and can only be used for 60days once bottle is opened. She is aware of handling recommendations per mfg: Handling and Disposal: Mycophenolate mofetil (MMF) has demonstrated teratogenic effects in humans. Wearing disposable gloves is recommended when wiping the outer surface of the bottle and or the bottle cap. Avoid direct contact of MYHIBBIN with skin or mucous membranes. Follow applicable special handling and disposal procedures according to OSHA Hazardous Drugs. Do not use after 60 days of first opening the bottle- ef        This onboarding is for the following medication:  1) Myhibbin (mycophenolate)      [facility]    Patient Onboarding/Medication Counseling    [name] is a 18 y.o. female with a kidney transplant who I am counseling today on initiation of therapy.  I am speaking to the patient.    Was a Nurse, learning disability used for this call? No    Verified patient's date of birth / HIPAA.    Specialty medication(s) to be sent: Transplant: Myhibbin 200mg /ml      Non-specialty medications/supplies to be sent: none      Medications not needed at this time: none         Cellcept (mycopheonlate mofetil)    Medication & Administration     Dosage: Take 2.3 mls (460mg ) by mouth two times daily    Administration:   Take by mouth with or without food.   Taking with food can minimize GI side effects.   Swallow capsules whole, do not crush or chew.  Oral suspension should be shaken well prior to administration.  Do not mix with other medications and discard any unused portion 60 days after constitution.      All oral products:  Give on an empty stomach at least 1 hour before or 2 hours after meals unless the doctor has told you otherwise.  Keep giving this drug to your child as you have been told by your child's doctor or other health care provider, even if your child feels well.  Give this drug at the same time of day.  If your child is taking an antacid that has aluminum or magnesium, give the antacid at least 2 hours after your child takes this drug.  All suspension products:  Shake well before use.  Measure liquid doses carefully. Use the measuring device that comes with this drug.  Do not mix with any other liquid drugs.  Wear gloves when preparing and giving a dose.  If this drug gets on your child's skin, wash it off right away with soap and water.  If this drug gets in the eyes, rinse with cool water.  If this drug is spilled, wipe it up using wet paper towels. Wipe the outside of the bottle after you have put the cap back on.  This drug may be given through certain sizes of feeding tubes. Some sizes of feeding tubes must not be used to give this drug. Use as you have been told. Flush the feeding tube after this drug is given. If you are not sure if you can use with a feeding tube, talk with the doctor.    Adherence/Missed dose instructions:  Take a missed dose as soon as you remember it . If it is close to the time of your next dose, skip the missed dose and resume your normal schedule.Never take 2 doses to try and catch  up from a missed dose.    Goals of Therapy     Prevent organ rejection    Side Effects & Monitoring Parameters     Feeling tired or weak  Shakiness  Trouble sleeping  Diarrhea, abdominal pain, nausea, vomiting, constipation or decreased appetite  Decreases in blood counts   Back or joint pain  Hypertension or hypotension  High blood sugar  Headache  Skin rash    The following side effects should be reported to the provider:  Reduced immune function - report signs of infection such as fever; chills; body aches; very bad sore throat; ear or sinus pain; cough; more sputum or change in color of sputum; pain with passing urine; wound that will not heal, etc.  Also at a slightly higher risk of some malignancies (mainly skin and blood cancers) due to this reduced immune function.  Allergic reaction (rash, hives, swelling, shortness of breath)  High blood sugar (confusion, feeling sleepy, more thirst, more hungry, passing urine more often, flushing, fast breathing, or breath that smells like fruit)  Electrolyte issues (mood changes, confusion, muscle pain or weakness, a heartbeat that does not feel normal, seizures, not hungry, or very bad upset stomach or throwing up)  High or low blood pressure (bad headache or dizziness, passing out, or change in eyesight)  Kidney issues (unable to pass urine, change in how much urine is passed, blood in the urine, or a big weight gain)  Skin (oozing, heat, swelling, redness, or pain), UTI and other infections   Chest pain or pressure  Abnormal heartbeat  Unexplained bleeding or bruising  Abnormal burning, numbness, or tingling  Muscle cramps,  Yellowing of skin or eyes    Monitoring parameters  Pregnancy   CBC   Renal and hepatic function    Contraindications, Warnings, & Precautions     *This is a REMS drug and an FDA-approved patient medication guide will be printed with each dispensation  Black Box Warning: Infections   Black Box Warning: Lymphoproliferative disorders - risk of development of lymphoma and skin malignancy is increased  Black Box Warning: Use during pregnancy is associated with increased risks of first trimester pregnancy loss and congenital malformations.   Black Box Warning: Females of reproductive potential should use contraception during treatment and for 6 weeks after therapy is discontinued  Is patient using an effective method of contraception? No  If yes, method of contraception:  n/a  CNS depression  New or reactivated viral infections  Neutropenia  Female patients and/or their female partners should use effective contraception during treatment of the female patient and for at least 3 months after last dose.  Breastfeeding is not recommended during therapy and for 6 weeks after last dose    Drug/Food Interactions     Medication list reviewed in Epic. The patient was instructed to inform the care team before taking any new medications or supplements. No drug interactions identified.   Separate doses of antacids and this medication  Check with your doctor before getting any vaccinations    Storage, Handling Precautions, & Disposal     Store at room temperature in a dry place  This medication is considered hazardous. Wash hands after handling and store out of reach or others, including children and pets.      Current Medications (including OTC/herbals), Comorbidities and Allergies     Current Outpatient Medications   Medication Sig Dispense Refill    carvedilol (COREG) 25 MG tablet Take 1 tablet (25 mg total)  by mouth Two (2) times a day. 60 tablet 11    mycophenolate mofetil (MYHIBBIN) 200 mg/mL Susp Take 2.3 mL by mouth two (2) times a day. 175 mL 11    tacrolimus 1 mg/mL oral suspension (CAPS) Take 0.9 mL (0.9 mg total) by mouth two (2) times a day. 54 mL 11     No current facility-administered medications for this visit.       Allergies   Allergen Reactions    Latex      Added based on information entered during case entry, please review and add reactions, type, and severity as needed       Patient Active Problem List   Diagnosis    Anemia    Stage 2 chronic kidney disease    Renal transplant recipient    Immunosuppressed status (CMS-HCC)       Medication list has been reviewed and updated in Epic: Yes    Allergies have been reviewed and updated in Epic: Yes    Appropriateness of Therapy     Acute infections noted within Epic:  No active infections  Patient reported infection: None    Is the medication and dose appropriate based on diagnosis, medication list, comorbidities, allergies, medical history, patient???s ability to self-administer the medication, and therapeutic goals? Yes    Prescription has been clinically reviewed: Yes      Baseline Quality of Life Assessment      How many days over the past month did your kidney transplant  keep you from your normal activities? For example, brushing your teeth or getting up in the morning. 0    Financial Information     Medication Assistance provided: None Required    Anticipated copay of $0/34 days reviewed with patient. Verified delivery address.    Delivery Information     Scheduled delivery date: 08/04/23    Expected start date: 08/05/23      Medication will be delivered via Same Day Courier to the prescription address in [facility].  This shipment will not require a signature.      Explained the services we provide at [facility] and that each month we would call to set up refills.  Stressed importance of returning phone calls so that we could ensure they receive their medications in time each month.  Informed patient that we should be setting up refills 7-10 days prior to when they will run out of medication.  A pharmacist will reach out to perform a clinical assessment periodically.  Informed patient that a welcome packet, containing information about our pharmacy and other support services, a Notice of Privacy Practices, and a drug information handout will be sent.      The patient or caregiver noted above participated in the development of this care plan and knows that they can request review of or adjustments to the care plan at any time.      Patient or caregiver verbalized understanding of the above information as well as how to contact the pharmacy at [phone] option 4 with any questions/concerns.  The pharmacy is open Monday through Friday 8:30am-4:30pm.  A pharmacist is available 24/7 via pager to answer any clinical questions they may have.    Patient Specific Needs     Does the patient have any physical, cognitive, or cultural barriers? No    Does the patient have adequate living arrangements? (i.e. the ability to store and take their medication appropriately) Yes    Did you  identify any home environmental safety or security hazards? No    Patient prefers to have medications discussed with  Patient     Is the patient or caregiver able to read and understand education materials at a high school level or above? Yes    Patient's primary language is  English     Is the patient high risk? Yes, patient is taking a REMS drug. Medication is dispensed in compliance with REMS program    Does the patient have an additional or emergency contact listed in their chart? Yes    SOCIAL DETERMINANTS OF HEALTH     At the [facility], we have learned that life circumstances - like trouble affording food, housing, utilities, or transportation can affect the health of many of our patients.   That is why we wanted to ask: are you currently experiencing any life circumstances that are negatively impacting your health and/or quality of life? Patient declined to answer    Social Drivers of Health     Food Insecurity: No Food Insecurity (04/14/2021)    Hunger Vital Sign     Worried About Running Out of Food in the Last Year: Never true     Ran Out of Food in the Last Year: Never true   Internet Connectivity: Not on file   Housing/Utilities: Low Risk  (04/14/2021)    Housing/Utilities     Within the past 12 months, have you ever stayed: outside, in a car, in a tent, in an overnight shelter, or temporarily in someone else's home (i.e. couch-surfing)?: No     Are you worried about losing your housing?: No     Within the past 12 months, have you been unable to get utilities (heat, electricity) when it was really needed?: No   Tobacco Use: Low Risk  (07/24/2023)    Patient History     Smoking Tobacco Use: Never     Smokeless Tobacco Use: Never     Passive Exposure: Never   Transportation Needs: No Transportation Needs (04/14/2021)    PRAPARE - Transportation     Lack of Transportation (Medical): No     Lack of Transportation (Non-Medical): No   Alcohol Use: Not At Risk (06/17/2021)    Alcohol Use     How often do you have a drink containing alcohol?: Never     How many drinks containing alcohol do you have on a typical day when you are drinking?: Not on file     How often do you have 5 or more drinks on one occasion?: Never   Interpersonal Safety: Not on file   Physical Activity: Not on file   Intimate Partner Violence: Not on file   Stress: Not on file   Substance Use: Not on file (04/06/2023)   Social Connections: Not on file   Financial Resource Strain: Low Risk  (04/14/2021)    Overall Financial Resource Strain (CARDIA)     Difficulty of Paying Living Expenses: Not very hard   Depression: Not on file   Health Literacy: Not on file       Would you be willing to receive help with any of the needs that you have identified today? Not applicable       Tera Helper, [facility]  [facility] Specialty Pharmacist

## 2023-08-01 NOTE — Unmapped (Signed)
 [facility] Specialty and Home Delivery Pharmacy Refill Coordination Note    Specialty Medication(s) to be Shipped:   Transplant: tacrolimus oral suspension 1mg /ml    Other medication(s) to be shipped: No additional medications requested for fill at this time     Michelle [name], DOB: June 21, 2005  Phone: [phone] (home)       All above HIPAA information was verified with patient.     Was a Nurse, learning disability used for this call? No    Completed refill call assessment today to schedule patient's medication shipment from the [facility] and Home Delivery Pharmacy  [phone]).  All relevant notes have been reviewed.     Specialty medication(s) and dose(s) confirmed: Regimen is correct and unchanged.   Changes to medications: Karl reports no changes at this time.  Changes to insurance: No  New side effects reported not previously addressed with a pharmacist or physician: None reported  Questions for the pharmacist: No    Confirmed patient received a Conservation officer, historic buildings and a Surveyor, mining with first shipment. The patient will receive a drug information handout for each medication shipped and additional FDA Medication Guides as required.       DISEASE/MEDICATION-SPECIFIC INFORMATION        N/A    SPECIALTY MEDICATION ADHERENCE     Medication Adherence    Patient reported X missed doses in the last month: 0  Specialty Medication: Tacrolimus 1mg /ml  Patient is on additional specialty medications: No              Were doses missed due to medication being on hold? No    Tacrolimus 1 mg/ml: 4 days of medicine on hand   REFERRAL TO PHARMACIST     Referral to the pharmacist: Not needed      [facility]     Shipping address confirmed in Epic.       Delivery Scheduled: Yes, Expected medication delivery date: 08/04/23.     Medication will be delivered via Same Day Courier to the prescription address in Epic WAM.    Tera Helper, [facility]   [facility] and [facility] Delivery Pharm[facility]  Specialty Pharmacist

## 2023-08-03 ENCOUNTER — Inpatient Hospital Stay: Admit: 2023-08-03 | Discharge: 2023-08-04 | Payer: PRIVATE HEALTH INSURANCE

## 2023-08-04 NOTE — Unmapped (Signed)
 Michelle [name] 's entire shipment will be rescheduled as a result of the medication is too soon to refill until 08/05/23.     I have spoken with the patient  at [phone]  and communicated the delivery change. We will reschedule the medication for the delivery date that the patient agreed upon.  We have confirmed the delivery date as 08/07/23, via same day courier.

## 2023-08-04 NOTE — Unmapped (Signed)
 Michelle [name] 's entire shipment will be delayed as a result of the medication is too soon to refill until 08/05/2023.     I have reached out to the patient via interpreter at [phone]  and left a voicemail message.  We will wait for a call back from the patient to reschedule the delivery.  We have not confirmed the new delivery date.

## 2023-08-05 MED ORDER — ONDANSETRON 4 MG DISINTEGRATING TABLET
ORAL_TABLET | Freq: Three times a day (TID) | 0 refills | 2.00 days | Status: CP | PRN
Start: 2023-08-05 — End: 2023-08-12

## 2023-08-07 DIAGNOSIS — D631 Anemia in chronic kidney disease: Principal | ICD-10-CM

## 2023-08-07 DIAGNOSIS — N189 Chronic kidney disease, unspecified: Principal | ICD-10-CM

## 2023-08-07 DIAGNOSIS — Z940 Kidney transplant status: Principal | ICD-10-CM

## 2023-08-07 DIAGNOSIS — D849 Immunodeficiency, unspecified: Principal | ICD-10-CM

## 2023-08-07 MED FILL — ORA-BLEND ORAL SUSPENSION, TACROLIMUS 5 MG CAPSULE, IMMEDIATE-RELEASE: ORAL | 30 days supply | Qty: 54 | Fill #5

## 2023-08-14 DIAGNOSIS — D631 Anemia in chronic kidney disease: Principal | ICD-10-CM

## 2023-08-14 DIAGNOSIS — N189 Chronic kidney disease, unspecified: Principal | ICD-10-CM

## 2023-08-14 DIAGNOSIS — D849 Immunodeficiency, unspecified: Principal | ICD-10-CM

## 2023-08-14 DIAGNOSIS — Z940 Kidney transplant status: Principal | ICD-10-CM

## 2023-08-21 DIAGNOSIS — N189 Chronic kidney disease, unspecified: Principal | ICD-10-CM

## 2023-08-21 DIAGNOSIS — D631 Anemia in chronic kidney disease: Principal | ICD-10-CM

## 2023-08-21 DIAGNOSIS — D849 Immunodeficiency, unspecified: Principal | ICD-10-CM

## 2023-08-21 DIAGNOSIS — Z940 Kidney transplant status: Principal | ICD-10-CM

## 2023-08-22 LAB — RENAL FUNCTION PANEL
ALBUMIN: 4.6 g/dL (ref 4.0–5.0)
BLOOD UREA NITROGEN: 29 mg/dL — ABNORMAL HIGH (ref 6–20)
BUN / CREAT RATIO: 20 (ref 9–23)
CALCIUM: 9.4 mg/dL (ref 8.7–10.2)
CHLORIDE: 108 mmol/L — ABNORMAL HIGH (ref 96–106)
CO2: 16 mmol/L — ABNORMAL LOW (ref 20–29)
CREATININE: 1.44 mg/dL — ABNORMAL HIGH (ref 0.57–1.00)
EGFR: 54 mL/min/{1.73_m2} — ABNORMAL LOW
GLUCOSE: 94 mg/dL (ref 70–99)
PHOSPHORUS, SERUM: 4.1 mg/dL (ref 3.3–5.1)
POTASSIUM: 4.8 mmol/L (ref 3.5–5.2)
SODIUM: 140 mmol/L (ref 134–144)

## 2023-08-22 LAB — CBC W/ DIFFERENTIAL
BANDED NEUTROPHILS ABSOLUTE COUNT: 0 10*3/uL (ref 0.0–0.1)
BASOPHILS ABSOLUTE COUNT: 0 10*3/uL (ref 0.0–0.2)
BASOPHILS RELATIVE PERCENT: 1 %
EOSINOPHILS ABSOLUTE COUNT: 0.1 10*3/uL (ref 0.0–0.4)
EOSINOPHILS RELATIVE PERCENT: 2 %
HEMATOCRIT: 31.7 % — ABNORMAL LOW (ref 34.0–46.6)
HEMOGLOBIN: 10.1 g/dL — ABNORMAL LOW (ref 11.1–15.9)
IMMATURE GRANULOCYTES: 0 %
LYMPHOCYTES ABSOLUTE COUNT: 0.8 10*3/uL (ref 0.7–3.1)
LYMPHOCYTES RELATIVE PERCENT: 26 %
MEAN CORPUSCULAR HEMOGLOBIN CONC: 31.9 g/dL (ref 31.5–35.7)
MEAN CORPUSCULAR HEMOGLOBIN: 25.7 pg — ABNORMAL LOW (ref 26.6–33.0)
MEAN CORPUSCULAR VOLUME: 81 fL (ref 79–97)
MONOCYTES ABSOLUTE COUNT: 0.3 10*3/uL (ref 0.1–0.9)
MONOCYTES RELATIVE PERCENT: 11 %
NEUTROPHILS ABSOLUTE COUNT: 1.8 10*3/uL (ref 1.4–7.0)
NEUTROPHILS RELATIVE PERCENT: 60 %
PLATELET COUNT: 233 10*3/uL (ref 150–450)
RED BLOOD CELL COUNT: 3.93 x10E6/uL (ref 3.77–5.28)
RED CELL DISTRIBUTION WIDTH: 13.4 % (ref 11.7–15.4)
WHITE BLOOD CELL COUNT: 3 10*3/uL — ABNORMAL LOW (ref 3.4–10.8)

## 2023-08-22 NOTE — Unmapped (Addendum)
 [facility] Specialty and Home Delivery Pharmacy Clinical Assessment & Refill Coordination Note    Michelle [name], DOB: [birth year]  Phone: [phone] (home)     All above HIPAA information was verified with patient.     Was a Nurse, learning disability used for this call? No    Specialty Medication(s):   Transplant: tacrolimus oral suspension 1mg /ml and Myhibbin 200mg /ml     Current Outpatient Medications   Medication Sig Dispense Refill    carvedilol (COREG) 25 MG tablet Take 1 tablet (25 mg total) by mouth Two (2) times a day. 60 tablet 11    mycophenolate mofetil (MYHIBBIN) 200 mg/mL Susp Take 2.3 mL by mouth two (2) times a day.   Discard 60 days after opening. 175 mL 11    tacrolimus 1 mg/mL oral suspension (CAPS) Take 0.9 mL (0.9 mg total) by mouth two (2) times a day. 54 mL 11     No current facility-administered medications for this visit.        Changes to medications: Layza reports no changes at this time.    Medication list has been reviewed and updated in Epic: Yes    Allergies   Allergen Reactions    Latex      Added based on information entered during case entry, please review and add reactions, type, and severity as needed       Changes to allergies: No    Allergies have been reviewed and updated in Epic: Yes    SPECIALTY MEDICATION ADHERENCE     Myhibbin 200 mg/ml: 15 days of medicine on hand   Tacrolimus 1 mg/ml: 15 days of medicine on hand   Medication Adherence    Patient reported X missed doses in the last month: 0  Specialty Medication: Myhibbin 200mg /ml  Patient is on additional specialty medications: Yes  Additional Specialty Medications: Tacrolimus 1mg /ml  Patient Reported Additional Medication X Missed Doses in the Last Month: 0  Patient is on more than two specialty medications: No          Specialty medication(s) dose(s) confirmed: Regimen is correct and unchanged.     Are there any concerns with adherence? No    Adherence counseling provided? Not needed    CLINICAL MANAGEMENT AND INTERVENTION Clinical Benefit Assessment:    Do you feel the medicine is effective or helping your condition? Yes    Clinical Benefit counseling provided? Not needed    Adverse Effects Assessment:    Are you experiencing any side effects? No    Are you experiencing difficulty administering your medicine? No    Quality of Life Assessment:    Quality of Life    Rheumatology  Oncology  Dermatology  Cystic Fibrosis          How many days over the past month did your kidney transplant  keep you from your normal activities? For example, brushing your teeth or getting up in the morning. 0    Have you discussed this with your provider? Not needed    Acute Infection Status:    Acute infections noted within Epic:  No active infections    Patient reported infection: None    Therapy Appropriateness:    Is therapy appropriate based on current medication list, adverse reactions, adherence, clinical benefit and progress toward achieving therapeutic goals? Yes, therapy is appropriate and should be continued     Clinical Intervention:    Was an intervention completed as part of this clinical assessment? No    DISEASE/MEDICATION-SPECIFIC INFORMATION  N/A    Solid Organ Transplant: Not Applicable    PATIENT SPECIFIC NEEDS     Does the patient have any physical, cognitive, or cultural barriers? No    Is the patient high risk? Yes, patient is taking a REMS drug. Medication is dispensed in compliance with REMS program    Does the patient require physician intervention or other additional services (i.e., nutrition, smoking cessation, social work)? No    Does the patient have an additional or emergency contact listed in their chart? Yes    SOCIAL DETERMINANTS OF HEALTH     At the [facility], we have learned that life circumstances - like trouble affording food, housing, utilities, or transportation can affect the health of many of our patients.   That is why we wanted to ask: are you currently experiencing any life circumstances that are negatively impacting your health and/or quality of life? Patient declined to answer    Social Drivers of Health     Food Insecurity: No Food Insecurity (04/14/2021)    Hunger Vital Sign     Worried About Running Out of Food in the Last Year: Never true     Ran Out of Food in the Last Year: Never true   Tobacco Use: Low Risk  (07/24/2023)    Patient History     Smoking Tobacco Use: Never     Smokeless Tobacco Use: Never     Passive Exposure: Never   Transportation Needs: No Transportation Needs (04/14/2021)    PRAPARE - Transportation     Lack of Transportation (Medical): No     Lack of Transportation (Non-Medical): No   Alcohol Use: Not At Risk (06/17/2021)    Alcohol Use     How often do you have a drink containing alcohol?: Never     How many drinks containing alcohol do you have on a typical day when you are drinking?: Not on file     How often do you have 5 or more drinks on one occasion?: Never   Housing: Not on file   Physical Activity: Not on file   Utilities: Not on file   Stress: Not on file   Interpersonal Safety: Not on file   Substance Use: Not on file (04/06/2023)   Intimate Partner Violence: Not on file   Social Connections: Not on file   Financial Resource Strain: Low Risk  (04/14/2021)    Overall Financial Resource Strain (CARDIA)     Difficulty of Paying Living Expenses: Not very hard   Depression: Not on file   Internet Connectivity: Not on file   Health Literacy: Not on file       Would you be willing to receive help with any of the needs that you have identified today? Not applicable       SHIPPING     Specialty Medication(s) to be Shipped:   Transplant: Patient declined myhibbin and tacrolimus today.    Other medication(s) to be shipped: No additional medications requested for fill at this time     Changes to insurance: No    Cost and Payment:  No medications scheduled today.    Delivery Scheduled: Patient declined refill at this time due to has 2 weeks on hand..     Medication will be delivered via UPS to the confirmed prescription address in [facility].    The patient will receive a drug information handout for each medication shipped and additional FDA Medication Guides as required.  Verified that patient  has previously received a Conservation officer, historic buildings and a Surveyor, mining.    The patient or caregiver noted above participated in the development of this care plan and knows that they can request review of or adjustments to the care plan at any time.      All of the patient's questions and concerns have been addressed.    Tera Helper, [facility]   [facility] Specialty and Home Delivery Pharmacy Specialty Pharmacist

## 2023-08-23 LAB — TACROLIMUS LEVEL: TACROLIMUS BLOOD: 17.4 ng/mL (ref 5.0–20.0)

## 2023-08-23 NOTE — Unmapped (Signed)
 Transplant Lab Follow-Up    Patient was called to discuss laboratory results. Tacrolimus level was elevated at 17.4 ng/mL (goal 5-7 ng/mL). Plan to decrease tacrolimus (Prograf) dose to 0.7 mg every 12 hours. Patient will get repeat labs in 1 week. Plan to follow-up with other labwork as results made available. All questions answered. Patient verbalized understanding & agreed with the plan.     Laboratory Results Summary     Immunosuppression  Administration Times: 0800/2000  Current Regimen: Tacrolimus 0.9 mg every 12 hours  NEW Regimen: Tacrolimus 0.7 mg every 12 hours  Trough Goal: 5-7 ng/mL    Lab Results   Component Value Date    TACROLIMUS 17.4 08/21/2023    TACROLIMUS 7.9 07/04/2023    TACROLIMUS 6.1 06/06/2023     Infectious Disease/Viral Testing    Lab Results   Component Value Date    CMV Viral Ld Not Detected 07/24/2023     Lab Results   Component Value Date    CMV Quant Negative 06/06/2023     Other Laboratory Results  Lab Results   Component Value Date    BUN 29 (H) 08/21/2023    CREATININE 1.44 (H) 08/21/2023    K 4.8 08/21/2023    GLU 92 03/06/2023    MG 1.8 04/06/2023     Lab Results   Component Value Date    WBC 3.0 (L) 08/21/2023    HGB 10.1 (L) 08/21/2023    HCT 31.7 (L) 08/21/2023    PLT 233 08/21/2023    NEUTROABS 1.8 08/21/2023       Melina Modena, PharmD, BCPPS, CPP  Clinical Pharmacist Practitioner - Pediatric Solid Organ Transplant

## 2023-08-28 DIAGNOSIS — D631 Anemia in chronic kidney disease: Principal | ICD-10-CM

## 2023-08-28 DIAGNOSIS — N189 Chronic kidney disease, unspecified: Principal | ICD-10-CM

## 2023-08-28 DIAGNOSIS — D849 Immunodeficiency, unspecified: Principal | ICD-10-CM

## 2023-08-28 DIAGNOSIS — Z940 Kidney transplant status: Principal | ICD-10-CM

## 2023-08-30 LAB — RENAL FUNCTION PANEL
ALBUMIN: 4.7 g/dL (ref 4.0–5.0)
BLOOD UREA NITROGEN: 21 mg/dL — ABNORMAL HIGH (ref 6–20)
BUN / CREAT RATIO: 15 (ref 9–23)
CALCIUM: 9.6 mg/dL (ref 8.7–10.2)
CHLORIDE: 105 mmol/L (ref 96–106)
CO2: 19 mmol/L — ABNORMAL LOW (ref 20–29)
CREATININE: 1.4 mg/dL — ABNORMAL HIGH (ref 0.57–1.00)
EGFR: 56 mL/min/{1.73_m2} — ABNORMAL LOW
GLUCOSE: 93 mg/dL (ref 70–99)
PHOSPHORUS, SERUM: 3.5 mg/dL (ref 3.3–5.1)
POTASSIUM: 4.9 mmol/L (ref 3.5–5.2)
SODIUM: 139 mmol/L (ref 134–144)

## 2023-08-30 LAB — CBC W/ DIFFERENTIAL
BANDED NEUTROPHILS ABSOLUTE COUNT: 0 10*3/uL (ref 0.0–0.1)
BASOPHILS ABSOLUTE COUNT: 0 10*3/uL (ref 0.0–0.2)
BASOPHILS RELATIVE PERCENT: 1 %
EOSINOPHILS ABSOLUTE COUNT: 0.1 10*3/uL (ref 0.0–0.4)
EOSINOPHILS RELATIVE PERCENT: 3 %
HEMATOCRIT: 31.9 % — ABNORMAL LOW (ref 34.0–46.6)
HEMOGLOBIN: 10.1 g/dL — ABNORMAL LOW (ref 11.1–15.9)
IMMATURE GRANULOCYTES: 0 %
LYMPHOCYTES ABSOLUTE COUNT: 0.6 10*3/uL — ABNORMAL LOW (ref 0.7–3.1)
LYMPHOCYTES RELATIVE PERCENT: 21 %
MEAN CORPUSCULAR HEMOGLOBIN CONC: 31.7 g/dL (ref 31.5–35.7)
MEAN CORPUSCULAR HEMOGLOBIN: 25.8 pg — ABNORMAL LOW (ref 26.6–33.0)
MEAN CORPUSCULAR VOLUME: 82 fL (ref 79–97)
MONOCYTES ABSOLUTE COUNT: 0.3 10*3/uL (ref 0.1–0.9)
MONOCYTES RELATIVE PERCENT: 10 %
NEUTROPHILS ABSOLUTE COUNT: 1.8 10*3/uL (ref 1.4–7.0)
NEUTROPHILS RELATIVE PERCENT: 65 %
PLATELET COUNT: 233 10*3/uL (ref 150–450)
RED BLOOD CELL COUNT: 3.91 x10E6/uL (ref 3.77–5.28)
RED CELL DISTRIBUTION WIDTH: 13.5 % (ref 11.7–15.4)
WHITE BLOOD CELL COUNT: 2.7 10*3/uL — ABNORMAL LOW (ref 3.4–10.8)

## 2023-08-31 LAB — TACROLIMUS LEVEL: TACROLIMUS BLOOD: 6.2 ng/mL (ref 5.0–20.0)

## 2023-08-31 NOTE — Unmapped (Signed)
 Transplant Lab Follow-Up    Patient was called to discuss laboratory results. Tacrolimus level was within goal range at 6.2 ng/mL (goal 5-7 ng/mL). Plan to continue current tacrolimus (Prograf) dose of 0.7 mg every 12 hours. Patient will get repeat labs in 1 month. Plan to follow-up with other labwork as results made available. All questions answered. Patient verbalized understanding & agreed with the plan.     Laboratory Results Summary     Immunosuppression  Administration Times: 0800/2000  Current Regimen: Tacrolimus 0.7 mg every 12 hours  NEW Regimen: Tacrolimus 0.7 mg every 12 hours  Trough Goal: 5-7 ng/mL    Lab Results   Component Value Date    TACROLIMUS 6.2 08/29/2023    TACROLIMUS 17.4 08/21/2023    TACROLIMUS 7.9 07/04/2023     Infectious Disease/Viral Testing    Lab Results   Component Value Date    CMV Viral Ld Not Detected 07/24/2023     Lab Results   Component Value Date    CMV Quant Negative 06/06/2023     Other Laboratory Results  Lab Results   Component Value Date    BUN 21 (H) 08/29/2023    CREATININE 1.40 (H) 08/29/2023    K 4.9 08/29/2023    GLU 92 03/06/2023    MG 1.8 04/06/2023     Lab Results   Component Value Date    WBC 2.7 (L) 08/29/2023    HGB 10.1 (L) 08/29/2023    HCT 31.9 (L) 08/29/2023    PLT 233 08/29/2023    NEUTROABS 1.8 08/29/2023       Melina Modena, PharmD, BCPPS, CPP  Clinical Pharmacist Practitioner - Pediatric Solid Organ Transplant

## 2023-09-04 ENCOUNTER — Ambulatory Visit: Admit: 2023-09-04 | Discharge: 2023-09-05 | Attending: Pediatric Nephrology | Primary: Pediatric Nephrology

## 2023-09-04 DIAGNOSIS — D849 Immunodeficiency, unspecified: Principal | ICD-10-CM

## 2023-09-04 DIAGNOSIS — D631 Anemia in chronic kidney disease: Principal | ICD-10-CM

## 2023-09-04 DIAGNOSIS — N182 Chronic kidney disease, stage 2 (mild): Principal | ICD-10-CM

## 2023-09-04 DIAGNOSIS — Z940 Kidney transplant status: Principal | ICD-10-CM

## 2023-09-04 DIAGNOSIS — N189 Chronic kidney disease, unspecified: Principal | ICD-10-CM

## 2023-09-04 MED ORDER — SUMATRIPTAN 20 MG/ACTUATION NASAL SPRAY
Freq: Once | NASAL | PRN refills | 0.00 days | Status: CP | PRN
Start: 2023-09-04 — End: ?

## 2023-09-04 MED ORDER — AMLODIPINE 5 MG TABLET
ORAL_TABLET | Freq: Every day | ORAL | 11 refills | 30.00 days | Status: CP
Start: 2023-09-04 — End: ?

## 2023-09-11 DIAGNOSIS — D631 Anemia in chronic kidney disease: Principal | ICD-10-CM

## 2023-09-11 DIAGNOSIS — N189 Chronic kidney disease, unspecified: Principal | ICD-10-CM

## 2023-09-11 DIAGNOSIS — Z940 Kidney transplant status: Principal | ICD-10-CM

## 2023-09-11 DIAGNOSIS — D849 Immunodeficiency, unspecified: Principal | ICD-10-CM

## 2023-09-11 NOTE — Unmapped (Signed)
 [facility] Specialty and Home Delivery Pharmacy Refill Coordination Note    Specialty Medication(s) to be Shipped:   Transplant: tacrolimus  oral suspension 1mg /ml    Other medication(s) to be shipped: No additional medications requested for fill at this time     Michelle [name], DOB: [birth year]  Phone: [phone] (home)       All above HIPAA information was verified with patient.     Was a Nurse, learning disability used for this call? No    Completed refill call assessment today to schedule patient's medication shipment from the [facility] and Home Delivery Pharmacy  [phone]).  All relevant notes have been reviewed.     Specialty medication(s) and dose(s) confirmed: Regimen is correct and unchanged.   Changes to medications: Michelle [name] reports no changes at this time.  Changes to insurance: No  New side effects reported not previously addressed with a pharmacist or physician: None reported  Questions for the pharmacist: No    Confirmed patient received a Conservation officer, historic buildings and a Surveyor, mining with first shipment. The patient will receive a drug information handout for each medication shipped and additional FDA Medication Guides as required.       DISEASE/MEDICATION-SPECIFIC INFORMATION        N/A    SPECIALTY MEDICATION ADHERENCE     Medication Adherence    Patient reported X missed doses in the last month: 0  Specialty Medication: tacrolimus  1 mg/mL oral suspension (CAPS)  Patient is on additional specialty medications: No              Were doses missed due to medication being on hold? No    Tacrolimus  1 mg/ml: 3 days of medicine on hand       REFERRAL TO PHARMACIST     Referral to the pharmacist: Not needed      [facility]     Shipping address confirmed in Epic.     Cost and Payment: Patient has a $0 copay, payment information is not required.    Delivery Scheduled: Yes, Expected medication delivery date: 09/13/2023.     Medication will be delivered via Same Day Courier to the prescription address in Epic WAM.    Louanna Rouse   [facility] Specialty and Home Delivery Pharmacy  Specialty Technician

## 2023-09-11 NOTE — Unmapped (Signed)
 [facility] Specialty and Home Delivery Pharmacy Refill Coordination Note    Specialty Medication(s) to be Shipped:   Transplant: Myhibbin     Other medication(s) to be shipped: No additional medications requested for fill at this time     Michelle [name], DOB: 08-20-2005  Phone: [phone] (home)       All above HIPAA information was verified with patient.     Was a Nurse, learning disability used for this call? No    Completed refill call assessment today to schedule patient's medication shipment from the [facility] and Home Delivery Pharmacy  [phone]).  All relevant notes have been reviewed.     Specialty medication(s) and dose(s) confirmed: Regimen is correct and unchanged.   Changes to medications: Brittanya reports no changes at this time.  Changes to insurance: No  New side effects reported not previously addressed with a pharmacist or physician: None reported  Questions for the pharmacist: No    Confirmed patient received a Conservation officer, historic buildings and a Surveyor, mining with first shipment. The patient will receive a drug information handout for each medication shipped and additional FDA Medication Guides as required.       DISEASE/MEDICATION-SPECIFIC INFORMATION        N/A    SPECIALTY MEDICATION ADHERENCE     Medication Adherence    Patient reported X missed doses in the last month: 0  Specialty Medication: mycophenolate  mofetil: MYHIBBIN  200 mg/mL Susp  Patient is on additional specialty medications: Yes  Additional Specialty Medications: tacrolimus  1 mg/mL oral suspension (CAPS)  Patient Reported Additional Medication X Missed Doses in the Last Month: 0  Patient is on more than two specialty medications: No              Were doses missed due to medication being on hold? No    Myhibbin  200 mg/ml: 1 doses of medicine on hand       REFERRAL TO PHARMACIST     Referral to the pharmacist: Not needed      [facility]     Shipping address confirmed in Epic.     Cost and Payment: Patient has a $0 copay, payment information is not required.    Delivery Scheduled: Yes, Expected medication delivery date: 09/12/2023.     Medication will be delivered via Same Day Courier to the prescription address in Epic WAM.    Louanna Rouse   [facility] Specialty and Home Delivery Pharmacy  Specialty Technician

## 2023-09-12 MED FILL — MYHIBBIN 200 MG/ML ORAL SUSPENSION: ORAL | 34 days supply | Qty: 175 | Fill #1

## 2023-09-12 NOTE — Unmapped (Signed)
 Pediatric Nephrology   Outpatient Return Visit Note     Referring Physician:    Toney Francois, MD  [address]  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 Allosure was great on 12/09/21 at 0.28.   Creatinine 07/04/23: 1.40 (range 1-1.5).   Urinalysis 04/10/23: 1+ blood, always has a little bit of blood.   DSA: None, last checked 07/24/23  Allosure:   0.21 11/07/22  0.24 03/06/23  0.22 07/24/23     # Immunosuppression:   - Prograf  goal 5-7  - MMF 460mg  BID, AUC done 4 weeks post transplant  - Changes in Immunosuppression: none  - Medications side effects: none     # HTN - not well controlled, diastolic hypertension  - B.P - goal is 110/65 (50th percentile).   - Continue carvedilol  25mg  BID.   - Add amlodipine  5mg  nightly, drop dose if dizzy      # Anemia - at goal  Goal for Hemoglobin >10, currently at goal  Iron  sat was 18%      # Infectious disease  EBV D+/R+, CMV D+/R+  All three viruses negative 07/24/23     # MBD - Calcium /Phosphorus   PTH 06/15/21: 69.1 no need to keep checking until GFR drops.      # Electrolytes:   No issues.      # Immunizations:   Immunization History   Administered Date(s) Administered    COVID-19 VAC,BIVALENT(11YR UP),PFIZER 04/15/2021    COVID-19 VACC,MRNA,(PFIZER)(PF) 01/16/2020, 02/06/2020    Covid-19 Vac, (72yr+) (Comirnaty) Mrna Pfizer  04/10/2023    DTaP / IPV 01/16/2008    DTaP, Unspecified Formulation 02/17/2004, 06/21/2004, 08/02/2004, 04/19/2005    HPV Quadrivalent (Gardasil) 01/09/2015, 03/27/2015, 07/24/2015    Hepatitis A Vaccine - Unspecified Formulation 01/17/2005, 07/25/2005    Hepatitis B Vaccine, Unspecified Formulation 09-23-03, 02/17/2004, 08/02/2004    Hepatitis B vaccine, pediatric/adolescent dosage, 04/15/2021    HiB, unspecified 02/17/2004, 06/21/2004, 08/02/2004    HiB-PRP-OMP 04/19/2005    Influenza Vaccine Quad(IM)6 MO-Adult(PF) 04/09/2021    MENINGOCOCCAL VACCINE, A,C,Y, W-135(IM)(MENVEO) 10/10/2020    MMR 01/17/2005, 01/16/2008    Meningococcal B Vaccine, OMV Adjuvanted(Bexsero) 10/10/2020    Meningococcal Conjugate MCV4P 01/09/2015    Pneumococcal Conjugate 13-Valent 04/15/2021    Pneumococcal conjugate -PCV7 02/17/2004, 06/21/2004, 08/02/2004, 01/17/2005    Polio Virus Vaccine, Unspecified Formulation 02/17/2004, 06/21/2004, 04/19/2005    TdaP 01/09/2015    Varicella 01/17/2005, 01/16/2008       Discharge Medications:     Current Outpatient Medications   Medication Sig Dispense Refill    carvedilol  (COREG ) 25 MG tablet Take 1 tablet (25 mg total) by mouth Two (2) times a day. 60 tablet 11    mycophenolate  mofetil (MYHIBBIN ) 200 mg/mL Susp Take 2.3 mL by mouth two (2) times a day.   Discard 60 days after opening. 175 mL 11    tacrolimus  1 mg/mL oral suspension (CAPS) Take 0.9 mL (0.9 mg total) by  mouth two (2) times a day. 54 mL 11    amlodipine  (NORVASC ) 5 MG tablet Take 1 tablet (5 mg total) by mouth daily. 30 tablet 11    sumatriptan  (IMITREX ) 20 mg/actuation nasal spray 1 spray (20 mg total) into alternating nostrils once as needed for migraine. May repeat dose in 2 hours if needed. No more than 2 sprays per day. 1 each PRN     No current facility-administered medications for this visit.     I personally spent 45 minutes face-to-face and non-face-to-face in the care of this patient, which includes all pre, intra, and post visit time on the date of service.    Subjective:     Michelle [name] is a 18 y.o. 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      Biopsy 06/17/21 for high creatinine when it was 1.6-1.8 without explanation showed no rejection, maybe some calcineurin inhibitor toxicity, acute tubular injury.      Allosure 12/09/21 when creatinine was 1.7 was reassuring at 0.28.      Still working in Chief Strategy Officer at [employer]. Lives at home with her parents. Not dating.    Review of Systems: ten systems reviewed and negative but for that noted in HPI      Medications:     Current Outpatient Medications   Medication Sig Dispense Refill    carvedilol  (COREG ) 25 MG tablet Take 1 tablet (25 mg total) by mouth Two (2) times a day. 60 tablet 11    mycophenolate  mofetil (MYHIBBIN ) 200 mg/mL Susp Take 2.3 mL by mouth two (2) times a day.   Discard 60 days after opening. 175 mL 11    tacrolimus  1 mg/mL oral suspension (CAPS) Take 0.9 mL (0.9 mg total) by mouth two (2) times a day. 54 mL 11    amlodipine  (NORVASC ) 5 MG tablet Take 1 tablet (5 mg total) by mouth daily. 30 tablet 11    sumatriptan  (IMITREX ) 20 mg/actuation nasal spray 1 spray (20 mg total) into alternating nostrils once as needed for migraine. May repeat dose in 2 hours if needed. No more than 2 sprays per day. 1 each PRN     No current facility-administered medications for this visit.       Allergies:     Allergies   Allergen Reactions    Latex      Added based on information entered during case entry, please review and add reactions, type, and severity as needed       Past Medical History:     Past Medical History:   Diagnosis Date    Hyperkalemia 12/17/2020    Hypertension 12/11/2020    Secondary hyperparathyroidism 12/17/2020       Objective:     PE:   BP 126/101 (BP Site: L Arm, BP Position: Sitting, BP Cuff Size: Large)  - Pulse 76  - Temp 36.4 ??C (97.6 ??F) (Temporal)  - Ht 158.8 cm (5' 2.5)  - Wt 72.4 kg (159 lb 9.6 oz)  - LMP 08/04/2023 (Approximate)  - BMI 28.73 kg/m??   87 %ile (Z= 1.13) based on CDC (Girls, 2-20 Years) weight-for-age data using data from 09/04/2023.  24 %ile (Z= -0.70) based on CDC (Girls, 2-20 Years) Stature-for-age data based on Stature recorded on 09/04/2023.  Blood pressure %iles  are not available for patients who are 18 years or older.  91 %ile (Z= 1.36) based on CDC (Girls, 2-20 Years) BMI-for-age based on BMI available on 09/04/2023.    General Appearance:  Healthy-appearing, well nourished, alert, interactive  HEENT: Sclerae white, EOMI, moist mucous membranes  Pulm: Normal RR and WOB   Renal:  Extremities without edema  Neuro: Alert; normal tone throughout        Labs:   Recent Results (from the past 4 weeks)   Renal Function Panel    Collection Time: 08/21/23  8:22 AM   Result Value Ref Range    Glucose 94 70 - 99 mg/dL    BUN 29 (H) 6 - 20 mg/dL    Creatinine 2.95 (H) 0.57 - 1.00 mg/dL    eGFR 54 (L) >62 ZH/YQM/5.78    BUN/Creatinine Ratio 20 9 - 23    Sodium 140 134 - 144 mmol/L    Potassium 4.8 3.5 - 5.2 mmol/L    Chloride 108 (H) 96 - 106 mmol/L    CO2 16 (L) 20 - 29 mmol/L    Calcium  9.4 8.7 - 10.2 mg/dL    Phosphorus, Serum 4.1 3.3 - 5.1 mg/dL    Albumin 4.6 4.0 - 5.0 g/dL   CBC w/ Differential    Collection Time: 08/21/23  8:22 AM   Result Value Ref Range    WBC 3.0 (L) 3.4 - 10.8 x10E3/uL    RBC 3.93 3.77 - 5.28 x10E6/uL    HGB 10.1 (L) 11.1 - 15.9 g/dL    HCT 46.9 (L) 62.9 - 46.6 %    MCV 81 79 - 97 fL    MCH 25.7 (L) 26.6 - 33.0 pg    MCHC 31.9 31.5 - 35.7 g/dL    RDW 52.8 41.3 - 24.4 %    Platelet 233 150 - 450 x10E3/uL    Neutrophils % 60 Not Estab. %    Lymphocytes % 26 Not Estab. %    Monocytes % 11 Not Estab. %    Eosinophils % 2 Not Estab. %    Basophils % 1 Not Estab. %    Absolute Neutrophils 1.8 1.4 - 7.0 x10E3/uL    Absolute Lymphocytes 0.8 0.7 - 3.1 x10E3/uL    Absolute Monocytes  0.3 0.1 - 0.9 x10E3/uL    Absolute Eosinophils 0.1 0.0 - 0.4 x10E3/uL    Absolute Basophils  0.0 0.0 - 0.2 x10E3/uL Immature Granulocytes 0 Not Estab. %    Bands Absolute 0.0 0.0 - 0.1 x10E3/uL   Tacrolimus  level    Collection Time: 08/21/23  8:22 AM   Result Value Ref Range    Tacrolimus  Lvl 17.4 5.0 - 20.0 ng/mL   Renal Function Panel    Collection Time: 08/29/23  8:17 AM   Result Value Ref Range    Glucose 93 70 - 99 mg/dL    BUN 21 (H) 6 - 20 mg/dL    Creatinine 0.10 (H) 0.57 - 1.00 mg/dL    eGFR 56 (L) >27 OZ/DGU/4.40    BUN/Creatinine Ratio 15 9 - 23    Sodium 139 134 - 144 mmol/L    Potassium 4.9 3.5 - 5.2 mmol/L    Chloride 105 96 - 106 mmol/L    CO2 19 (L) 20 - 29 mmol/L    Calcium  9.6 8.7 - 10.2 mg/dL    Phosphorus, Serum 3.5 3.3 - 5.1 mg/dL    Albumin 4.7 4.0 - 5.0 g/dL   CBC w/ Differential  Collection Time: 08/29/23  8:17 AM   Result Value Ref Range    WBC 2.7 (L) 3.4 - 10.8 x10E3/uL    RBC 3.91 3.77 - 5.28 x10E6/uL    HGB 10.1 (L) 11.1 - 15.9 g/dL    HCT 16.1 (L) 09.6 - 46.6 %    MCV 82 79 - 97 fL    MCH 25.8 (L) 26.6 - 33.0 pg    MCHC 31.7 31.5 - 35.7 g/dL    RDW 04.5 40.9 - 81.1 %    Platelet 233 150 - 450 x10E3/uL    Neutrophils % 65 Not Estab. %    Lymphocytes % 21 Not Estab. %    Monocytes % 10 Not Estab. %    Eosinophils % 3 Not Estab. %    Basophils % 1 Not Estab. %    Absolute Neutrophils 1.8 1.4 - 7.0 x10E3/uL    Absolute Lymphocytes 0.6 (L) 0.7 - 3.1 x10E3/uL    Absolute Monocytes  0.3 0.1 - 0.9 x10E3/uL    Absolute Eosinophils 0.1 0.0 - 0.4 x10E3/uL    Absolute Basophils  0.0 0.0 - 0.2 x10E3/uL    Immature Granulocytes 0 Not Estab. %    Bands Absolute 0.0 0.0 - 0.1 x10E3/uL   Tacrolimus  level    Collection Time: 08/29/23  8:17 AM   Result Value Ref Range    Tacrolimus  Lvl 6.2 5.0 - 20.0 ng/mL   POCT Urinalysis Dipstick    Collection Time: 09/04/23  4:50 PM   Result Value Ref Range    Spec Gravity/POC 1.010 1.003 - 1.030    PH/POC 5.5 5.0 - 9.0    Leuk Esterase/POC Negative Negative    Nitrite/POC Negative Negative    Protein/POC Negative Negative    UA Glucose/POC Negative Negative    Ketones, POC Negative Negative    Bilirubin/POC Negative Negative    Blood/POC Trace-lysed (A) Negative    Urobilinogen/POC 0.2 0.2 - 1.0 mg/dL

## 2023-09-13 MED FILL — ORA-BLEND ORAL SUSPENSION, TACROLIMUS 5 MG CAPSULE, IMMEDIATE-RELEASE: ORAL | 30 days supply | Qty: 54 | Fill #6

## 2023-09-18 DIAGNOSIS — D849 Immunodeficiency, unspecified: Principal | ICD-10-CM

## 2023-09-18 DIAGNOSIS — Z940 Kidney transplant status: Principal | ICD-10-CM

## 2023-09-18 DIAGNOSIS — D631 Anemia in chronic kidney disease: Principal | ICD-10-CM

## 2023-09-18 DIAGNOSIS — N189 Chronic kidney disease, unspecified: Principal | ICD-10-CM

## 2023-09-25 DIAGNOSIS — D849 Immunodeficiency, unspecified: Principal | ICD-10-CM

## 2023-09-25 DIAGNOSIS — D631 Anemia in chronic kidney disease: Principal | ICD-10-CM

## 2023-09-25 DIAGNOSIS — Z940 Kidney transplant status: Principal | ICD-10-CM

## 2023-09-25 DIAGNOSIS — N189 Chronic kidney disease, unspecified: Principal | ICD-10-CM

## 2023-09-26 ENCOUNTER — Other Ambulatory Visit: Admit: 2023-09-26 | Discharge: 2023-09-27 | Payer: Medicaid (Managed Care)

## 2023-09-26 DIAGNOSIS — Z940 Kidney transplant status: Principal | ICD-10-CM

## 2023-09-26 LAB — RENAL FUNCTION PANEL
ALBUMIN: 4.2 g/dL (ref 3.4–5.0)
ANION GAP: 13 mmol/L (ref 5–14)
BLOOD UREA NITROGEN: 32 mg/dL — ABNORMAL HIGH (ref 9–23)
BUN / CREAT RATIO: 23
CALCIUM: 9.6 mg/dL (ref 8.7–10.4)
CHLORIDE: 109 mmol/L — ABNORMAL HIGH (ref 98–107)
CO2: 21.7 mmol/L (ref 20.0–31.0)
CREATININE: 1.38 mg/dL — ABNORMAL HIGH (ref 0.55–1.02)
EGFR CKD-EPI (2021) FEMALE: 57 mL/min/1.73m2 — ABNORMAL LOW (ref >=60)
GLUCOSE RANDOM: 101 mg/dL — ABNORMAL HIGH (ref 70–99)
PHOSPHORUS: 3.8 mg/dL (ref 2.4–5.1)
POTASSIUM: 4.6 mmol/L (ref 3.4–4.8)
SODIUM: 144 mmol/L (ref 135–145)

## 2023-09-26 LAB — CBC W/ AUTO DIFF
BASOPHILS ABSOLUTE COUNT: 0 10*9/L (ref 0.0–0.1)
BASOPHILS RELATIVE PERCENT: 0.6 %
EOSINOPHILS ABSOLUTE COUNT: 0.1 10*9/L (ref 0.0–0.5)
EOSINOPHILS RELATIVE PERCENT: 2.3 %
HEMATOCRIT: 29.7 % — ABNORMAL LOW (ref 34.0–44.0)
HEMOGLOBIN: 10.2 g/dL — ABNORMAL LOW (ref 11.3–14.9)
LYMPHOCYTES ABSOLUTE COUNT: 0.6 10*9/L — ABNORMAL LOW (ref 1.1–3.6)
LYMPHOCYTES RELATIVE PERCENT: 16.4 %
MEAN CORPUSCULAR HEMOGLOBIN CONC: 34.4 g/dL (ref 32.3–35.0)
MEAN CORPUSCULAR HEMOGLOBIN: 26 pg (ref 25.9–32.4)
MEAN CORPUSCULAR VOLUME: 75.6 fL — ABNORMAL LOW (ref 77.6–95.7)
MEAN PLATELET VOLUME: 9.5 fL (ref 7.3–10.7)
MONOCYTES ABSOLUTE COUNT: 0.4 10*9/L (ref 0.3–0.8)
MONOCYTES RELATIVE PERCENT: 10.1 %
NEUTROPHILS ABSOLUTE COUNT: 2.8 10*9/L (ref 1.5–6.4)
NEUTROPHILS RELATIVE PERCENT: 70.6 %
PLATELET COUNT: 214 10*9/L (ref 170–380)
RED BLOOD CELL COUNT: 3.93 10*12/L — ABNORMAL LOW (ref 3.95–5.13)
RED CELL DISTRIBUTION WIDTH: 14.3 % (ref 12.2–15.2)
WBC ADJUSTED: 3.9 10*9/L — ABNORMAL LOW (ref 4.2–10.2)

## 2023-09-26 LAB — MAGNESIUM: MAGNESIUM: 1.5 mg/dL — ABNORMAL LOW (ref 1.6–2.6)

## 2023-09-26 LAB — TACROLIMUS LEVEL, TROUGH: TACROLIMUS, TROUGH: 8 ng/mL (ref 5.0–15.0)

## 2023-09-27 LAB — EBV QUANTITATIVE PCR, BLOOD: EBV VIRAL LOAD RESULT: NOT DETECTED

## 2023-09-27 LAB — CMV DNA, QUANTITATIVE, PCR: CMV VIRAL LD: NOT DETECTED

## 2023-09-27 LAB — BK VIRUS QUANTITATIVE PCR, BLOOD: BK BLOOD RESULT: NOT DETECTED

## 2023-09-27 NOTE — Unmapped (Signed)
 Transplant Lab Follow-Up    Patient was called to discuss laboratory results. Tacrolimus level was within goal range at 6.2 ng/mL (goal 5-7 ng/mL). Plan to continue current tacrolimus (Prograf) dose of 0.7 mg every 12 hours. Patient will get repeat labs in 2 weeks due to slightly elevated level. Plan to follow-up with other labwork as results made available. All questions answered. Patient verbalized understanding & agreed with the plan.     Laboratory Results Summary     Immunosuppression  Administration Times: 0800/2000  Current Regimen: Tacrolimus 0.7 mg every 12 hours  NEW Regimen: Tacrolimus 0.7 mg every 12 hours  Trough Goal: 5-7 ng/mL    Lab Results   Component Value Date    TACROLIMUS 8.0 09/26/2023    TACROLIMUS 6.2 08/29/2023    TACROLIMUS 17.4 08/21/2023     Infectious Disease/Viral Testing    Lab Results   Component Value Date    CMV Viral Ld Not Detected 09/26/2023     Lab Results   Component Value Date    CMV Quant Negative 06/06/2023     Other Laboratory Results  Lab Results   Component Value Date    BUN 32 (H) 09/26/2023    CREATININE 1.38 (H) 09/26/2023    K 4.6 09/26/2023    GLU 101 (H) 09/26/2023    MG 1.5 (L) 09/26/2023     Lab Results   Component Value Date    WBC 3.9 (L) 09/26/2023    HGB 10.2 (L) 09/26/2023    HCT 29.7 (L) 09/26/2023    PLT 214 09/26/2023    NEUTROABS 2.8 09/26/2023       Janyth Meres, PharmD, BCPPS, CPP  Clinical Pharmacist Practitioner - Pediatric Solid Organ Transplant

## 2023-09-28 LAB — FSAB CLASS 2 ANTIBODY SPECIFICITY: HLA CL2 AB RESULT: POSITIVE

## 2023-09-28 LAB — HLA DS POST TRANSPLANT
ANTI-DONOR DRW #1 MFI: 20 MFI
ANTI-DONOR HLA-A #1 MFI: 162 MFI
ANTI-DONOR HLA-A #2 MFI: 26 MFI
ANTI-DONOR HLA-B #1 MFI: 0 MFI
ANTI-DONOR HLA-B #2 MFI: 0 MFI
ANTI-DONOR HLA-C #1 MFI: 0 MFI
ANTI-DONOR HLA-DQB #1 MFI: 0 MFI
ANTI-DONOR HLA-DQB #2 MFI: 54 MFI
ANTI-DONOR HLA-DR #1 MFI: 0 MFI
ANTI-DONOR HLA-DR #2 MFI: 202 MFI

## 2023-09-28 LAB — FSAB CLASS 1 ANTIBODY SPECIFICITY: HLA CLASS 1 ANTIBODY RESULT: NEGATIVE

## 2023-10-02 DIAGNOSIS — Z940 Kidney transplant status: Principal | ICD-10-CM

## 2023-10-02 DIAGNOSIS — D849 Immunodeficiency, unspecified: Principal | ICD-10-CM

## 2023-10-02 DIAGNOSIS — D631 Anemia in chronic kidney disease: Principal | ICD-10-CM

## 2023-10-02 DIAGNOSIS — N189 Chronic kidney disease, unspecified: Principal | ICD-10-CM

## 2023-10-04 ENCOUNTER — Ambulatory Visit: Admit: 2023-10-04 | Payer: Medicaid (Managed Care)

## 2023-10-06 NOTE — Unmapped (Signed)
 Called patient regarding follow up appt with Dr. Dyana Glade in August. No answer. LVM. Recall updated. Spoke with patient's mom regarding location change-offered to schedule in [place] or [place]. Pt will call back when available.

## 2023-10-09 DIAGNOSIS — N189 Chronic kidney disease, unspecified: Principal | ICD-10-CM

## 2023-10-09 DIAGNOSIS — Z940 Kidney transplant status: Principal | ICD-10-CM

## 2023-10-09 DIAGNOSIS — D631 Anemia in chronic kidney disease: Principal | ICD-10-CM

## 2023-10-09 DIAGNOSIS — D849 Immunodeficiency, unspecified: Principal | ICD-10-CM

## 2023-10-12 NOTE — Unmapped (Addendum)
 [facility] Specialty and Home Delivery Pharmacy Refill Coordination Note    Specialty Medication(s) to be Shipped:   Transplant: Myhibbin and tacrolimus oral suspension 1mg /ml    Other medication(s) to be shipped: No additional medications requested for fill at this time     Michelle [name], DOB: Dec 09, 2005  Phone: [phone] (home)       All above HIPAA information was verified with patient.     Was a Nurse, learning disability used for this call? No    Completed refill call assessment today to schedule patient's medication shipment from the [facility] and Home Delivery Pharmacy  [phone]).  All relevant notes have been reviewed.     Specialty medication(s) and dose(s) confirmed: Regimen is correct and unchanged.   Changes to medications: [name] reports no changes at this time.  Changes to insurance: No  New side effects reported not previously addressed with a pharmacist or physician: None reported  Questions for the pharmacist: No    Confirmed patient received a Conservation officer, historic buildings and a Surveyor, mining with first shipment. The patient will receive a drug information handout for each medication shipped and additional FDA Medication Guides as required.       DISEASE/MEDICATION-SPECIFIC INFORMATION        N/A    SPECIALTY MEDICATION ADHERENCE     Medication Adherence    Patient reported X missed doses in the last month: 0  Specialty Medication: tacrolimus 1 mg/mL oral suspension (CAPS)  Patient is on additional specialty medications: Yes  Additional Specialty Medications: mycophenolate mofetil: MYHIBBIN 200 mg/mL Susp  Patient Reported Additional Medication X Missed Doses in the Last Month: 0  Patient is on more than two specialty medications: No  Demonstrates understanding of importance of adherence: yes              Were doses missed due to medication being on hold? No    Tacrolimus 1 mg/ml: 3 days of medicine on hand   mycophenolate mofetil: MYHIBBIN 200 mg/mL Susp      REFERRAL TO PHARMACIST     Referral to the pharmacist: Not needed      [facility]     Shipping address confirmed in Epic.     Cost and Payment: Patient has a $0 copay, payment information is not required.    Delivery Scheduled: Yes, Expected medication delivery date: 10/13/23 ( Myhibbin) and  10/17/2023 (  tacrolimus ).     Medication will be delivered via Same Day Courier to the prescription address in Epic WAM.    Michelle [name]   [facility] Specialty and Home Delivery Pharmacy  Specialty Technician

## 2023-10-13 MED FILL — MYHIBBIN 200 MG/ML ORAL SUSPENSION: ORAL | 34 days supply | Qty: 175 | Fill #2

## 2023-10-16 DIAGNOSIS — N189 Chronic kidney disease, unspecified: Principal | ICD-10-CM

## 2023-10-16 DIAGNOSIS — Z940 Kidney transplant status: Principal | ICD-10-CM

## 2023-10-16 DIAGNOSIS — D631 Anemia in chronic kidney disease: Principal | ICD-10-CM

## 2023-10-16 DIAGNOSIS — D849 Immunodeficiency, unspecified: Principal | ICD-10-CM

## 2023-10-17 MED FILL — ORA-BLEND ORAL SUSPENSION, TACROLIMUS 5 MG CAPSULE, IMMEDIATE-RELEASE: ORAL | 30 days supply | Qty: 54 | Fill #7

## 2023-10-23 DIAGNOSIS — D849 Immunodeficiency, unspecified: Principal | ICD-10-CM

## 2023-10-23 DIAGNOSIS — Z940 Kidney transplant status: Principal | ICD-10-CM

## 2023-10-23 DIAGNOSIS — N189 Chronic kidney disease, unspecified: Principal | ICD-10-CM

## 2023-10-23 DIAGNOSIS — D631 Anemia in chronic kidney disease: Principal | ICD-10-CM

## 2023-11-02 LAB — CBC W/ DIFFERENTIAL
BANDED NEUTROPHILS ABSOLUTE COUNT: 0 10*3/uL (ref 0.0–0.1)
BASOPHILS ABSOLUTE COUNT: 0 10*3/uL (ref 0.0–0.2)
BASOPHILS RELATIVE PERCENT: 1 %
EOSINOPHILS ABSOLUTE COUNT: 0.1 10*3/uL (ref 0.0–0.4)
EOSINOPHILS RELATIVE PERCENT: 2 %
HEMATOCRIT: 29.7 % — ABNORMAL LOW (ref 34.0–46.6)
HEMOGLOBIN: 9.3 g/dL — ABNORMAL LOW (ref 11.1–15.9)
IMMATURE GRANULOCYTES: 0 %
LYMPHOCYTES ABSOLUTE COUNT: 0.8 10*3/uL (ref 0.7–3.1)
LYMPHOCYTES RELATIVE PERCENT: 18 %
MEAN CORPUSCULAR HEMOGLOBIN CONC: 31.3 g/dL — ABNORMAL LOW (ref 31.5–35.7)
MEAN CORPUSCULAR HEMOGLOBIN: 25.1 pg — ABNORMAL LOW (ref 26.6–33.0)
MEAN CORPUSCULAR VOLUME: 80 fL (ref 79–97)
MONOCYTES ABSOLUTE COUNT: 0.3 10*3/uL (ref 0.1–0.9)
MONOCYTES RELATIVE PERCENT: 7 %
NEUTROPHILS ABSOLUTE COUNT: 3.2 10*3/uL (ref 1.4–7.0)
NEUTROPHILS RELATIVE PERCENT: 72 %
PLATELET COUNT: 264 10*3/uL (ref 150–450)
RED BLOOD CELL COUNT: 3.71 x10E6/uL — ABNORMAL LOW (ref 3.77–5.28)
RED CELL DISTRIBUTION WIDTH: 14.2 % (ref 11.7–15.4)
WHITE BLOOD CELL COUNT: 4.4 10*3/uL (ref 3.4–10.8)

## 2023-11-02 LAB — RENAL FUNCTION PANEL
ALBUMIN: 4.6 g/dL (ref 4.0–5.0)
BLOOD UREA NITROGEN: 23 mg/dL — ABNORMAL HIGH (ref 6–20)
BUN / CREAT RATIO: 15 (ref 9–23)
CALCIUM: 9.4 mg/dL (ref 8.7–10.2)
CHLORIDE: 108 mmol/L — ABNORMAL HIGH (ref 96–106)
CO2: 16 mmol/L — ABNORMAL LOW (ref 20–29)
CREATININE: 1.53 mg/dL — ABNORMAL HIGH (ref 0.57–1.00)
EGFR: 50 mL/min/{1.73_m2} — ABNORMAL LOW
GLUCOSE: 94 mg/dL (ref 70–99)
PHOSPHORUS, SERUM: 3.7 mg/dL (ref 3.3–5.1)
POTASSIUM: 4.5 mmol/L (ref 3.5–5.2)
SODIUM: 140 mmol/L (ref 134–144)

## 2023-11-03 LAB — TACROLIMUS LEVEL: TACROLIMUS BLOOD: 8.2 ng/mL (ref 5.0–20.0)

## 2023-11-09 NOTE — Unmapped (Addendum)
 [facility] Specialty and Home Delivery Pharmacy Refill Coordination Note    Specialty Medication(s) to be Shipped:   Transplant: Myhibbin and tacrolimus oral suspension 1mg /ml    Other medication(s) to be shipped: No additional medications requested for fill at this time     Michelle [name], DOB: 03-Jan-2006  Phone: [phone] (home)       All above HIPAA information was verified with patient.     Was a Nurse, learning disability used for this call? No    Completed refill call assessment today to schedule patient's medication shipment from the [facility] and Home Delivery Pharmacy  [phone]).  All relevant notes have been reviewed.     Specialty medication(s) and dose(s) confirmed: Regimen is correct and unchanged.   Changes to medications: Braelee reports no changes at this time.  Changes to insurance: No  New side effects reported not previously addressed with a pharmacist or physician: None reported  Questions for the pharmacist: No    Confirmed patient received a Conservation officer, historic buildings and a Surveyor, mining with first shipment. The patient will receive a drug information handout for each medication shipped and additional FDA Medication Guides as required.       DISEASE/MEDICATION-SPECIFIC INFORMATION        N/A    SPECIALTY MEDICATION ADHERENCE     Medication Adherence    Patient reported X missed doses in the last month: 0  Specialty Medication: MYHIBBIN 200 mg/mL Susp (mycophenolate mofetil)  Patient is on additional specialty medications: Yes  Additional Specialty Medications: tacrolimus 1 mg/mL oral suspension (CAPS)  Patient Reported Additional Medication X Missed Doses in the Last Month: 0  Patient is on more than two specialty medications: No  Informant: patient              Were doses missed due to medication being on hold? No    Tacrolimus 1 mg/ml: 4 days of medicine on hand   mycophenolate mofetil: MYHIBBIN 200 mg/mL Susp: 4 days of medicine on hand       REFERRAL TO PHARMACIST     Referral to the pharmacist: Not needed      [facility]     Shipping address confirmed in Epic.     Cost and Payment: Patient has a $0 copay, payment information is not required.    Delivery Scheduled: Yes, Expected medication delivery date: 6/17.     Medication will be delivered via Same Day Courier to the prescription address in Epic WAM.    [facility]

## 2023-11-14 MED FILL — ORA-BLEND ORAL SUSPENSION, TACROLIMUS 5 MG CAPSULE, IMMEDIATE-RELEASE: ORAL | 30 days supply | Qty: 54 | Fill #8

## 2023-11-14 MED FILL — MYHIBBIN 200 MG/ML ORAL SUSPENSION: ORAL | 34 days supply | Qty: 175 | Fill #3

## 2023-11-28 MED ORDER — CARVEDILOL 25 MG TABLET
ORAL_TABLET | Freq: Two times a day (BID) | ORAL | 8 refills | 30.00000 days | Status: CP
Start: 2023-11-28 — End: ?

## 2023-11-28 NOTE — Unmapped (Signed)
 Per the last MD note, refill Carvedilol  25mg  tablets twice daily, #60 tabs with 8 refills.

## 2023-11-30 LAB — CBC W/ DIFFERENTIAL
BANDED NEUTROPHILS ABSOLUTE COUNT: 0 x10E3/uL (ref 0.0–0.1)
BASOPHILS ABSOLUTE COUNT: 0 x10E3/uL (ref 0.0–0.2)
BASOPHILS RELATIVE PERCENT: 0 %
EOSINOPHILS ABSOLUTE COUNT: 0.1 x10E3/uL (ref 0.0–0.4)
EOSINOPHILS RELATIVE PERCENT: 2 %
HEMATOCRIT: 31.6 % — ABNORMAL LOW (ref 34.0–46.6)
HEMOGLOBIN: 9.6 g/dL — ABNORMAL LOW (ref 11.1–15.9)
IMMATURE GRANULOCYTES: 0 %
LYMPHOCYTES ABSOLUTE COUNT: 0.7 x10E3/uL (ref 0.7–3.1)
LYMPHOCYTES RELATIVE PERCENT: 20 %
MEAN CORPUSCULAR HEMOGLOBIN CONC: 30.4 g/dL — ABNORMAL LOW (ref 31.5–35.7)
MEAN CORPUSCULAR HEMOGLOBIN: 24.7 pg — ABNORMAL LOW (ref 26.6–33.0)
MEAN CORPUSCULAR VOLUME: 81 fL (ref 79–97)
MONOCYTES ABSOLUTE COUNT: 0.4 x10E3/uL (ref 0.1–0.9)
MONOCYTES RELATIVE PERCENT: 11 %
NEUTROPHILS ABSOLUTE COUNT: 2.2 x10E3/uL (ref 1.4–7.0)
NEUTROPHILS RELATIVE PERCENT: 67 %
PLATELET COUNT: 257 x10E3/uL (ref 150–450)
RED BLOOD CELL COUNT: 3.89 x10E6/uL (ref 3.77–5.28)
RED CELL DISTRIBUTION WIDTH: 13.5 % (ref 11.7–15.4)
WHITE BLOOD CELL COUNT: 3.3 x10E3/uL — ABNORMAL LOW (ref 3.4–10.8)

## 2023-11-30 LAB — RENAL FUNCTION PANEL
ALBUMIN: 4.5 g/dL (ref 4.0–5.0)
BLOOD UREA NITROGEN: 23 mg/dL — ABNORMAL HIGH (ref 6–20)
BUN / CREAT RATIO: 15 (ref 9–23)
CALCIUM: 9.6 mg/dL (ref 8.7–10.2)
CHLORIDE: 106 mmol/L (ref 96–106)
CO2: 18 mmol/L — ABNORMAL LOW (ref 20–29)
CREATININE: 1.52 mg/dL — ABNORMAL HIGH (ref 0.57–1.00)
EGFR: 50 mL/min/1.73 — ABNORMAL LOW
GLUCOSE: 95 mg/dL (ref 70–99)
PHOSPHORUS, SERUM: 3.3 mg/dL (ref 3.3–5.1)
POTASSIUM: 4.5 mmol/L (ref 3.5–5.2)
SODIUM: 139 mmol/L (ref 134–144)

## 2023-11-30 NOTE — Unmapped (Signed)
 Clinical Social Worker Progress Note    Name:Michelle [name]  Date of Birth:06/11/2005  FMW:[id number]    RE: NO SHOW      Noted that patient had appointment scheduled today.  As of the writing of this note, patient is a no show/no call.    At this time, this CSW will be unable to see patient due to inability to reach patient.      Patient will need to be rescheduled at this time.      Delon Ferraris, LCSW, CCTSW  Transplant Case Manager  [facility]

## 2023-12-01 LAB — TACROLIMUS LEVEL: TACROLIMUS BLOOD: 8.4 ng/mL (ref 5.0–20.0)

## 2023-12-08 DIAGNOSIS — Z940 Kidney transplant status: Principal | ICD-10-CM

## 2023-12-08 MED ORDER — TACROLIMUS ORAL SUS 1MG/ML (CAPS)
Freq: Two times a day (BID) | ORAL | 11 refills | 30.00000 days | Status: CP
Start: 2023-12-08 — End: ?

## 2023-12-09 DIAGNOSIS — Z940 Kidney transplant status: Principal | ICD-10-CM

## 2023-12-09 MED ORDER — TACROLIMUS ORAL SUS 1MG/ML (CAPS)
Freq: Two times a day (BID) | ORAL | 11 refills | 30.00000 days | Status: CP
Start: 2023-12-09 — End: ?
  Filled 2023-12-22: qty 48, 30d supply, fill #0

## 2023-12-11 NOTE — Unmapped (Addendum)
 Confirmed with patient decreased dose tacrolimus  suspension on 7/23 - jem      Clinical Assessment Needed For: Dose Change  Medication: Tacrolimus  1 mg/ ml   Last Fill Date/Day Supply: 11/14/2023 / 30  Copay $0.00  Was previous dose already scheduled to fill: No    Notes to Pharmacist: n/a

## 2023-12-15 NOTE — Unmapped (Signed)
 [facility] Specialty and Home Delivery Pharmacy Refill Coordination Note    Specialty Medication(s) to be Shipped:   Transplant: Myhibbin     Other medication(s) to be shipped: No additional medications requested for fill at this time     Michelle [name], DOB: Oct 25, 2005  Phone: [phone] (home)       All above HIPAA information was verified with patient.     Was a Nurse, learning disability used for this call? No    Completed refill call assessment today to schedule patient's medication shipment from the [facility] and Home Delivery Pharmacy  [phone]).  All relevant notes have been reviewed.     Specialty medication(s) and dose(s) confirmed: Regimen is correct and unchanged.   Changes to medications: Austina reports no changes at this time.  Changes to insurance: No  New side effects reported not previously addressed with a pharmacist or physician: None reported  Questions for the pharmacist: No    Confirmed patient received a Conservation officer, historic buildings and a Surveyor, mining with first shipment. The patient will receive a drug information handout for each medication shipped and additional FDA Medication Guides as required.       DISEASE/MEDICATION-SPECIFIC INFORMATION        N/A    SPECIALTY MEDICATION ADHERENCE     Medication Adherence    Patient reported X missed doses in the last month: 0  Specialty Medication: tacrolimus  1 mg/mL oral suspension (CAPS)  Patient is on additional specialty medications: Yes  Additional Specialty Medications: mycophenolate : MYHIBBIN  200 mg/mL suspension  Patient Reported Additional Medication X Missed Doses in the Last Month: 0  Patient is on more than two specialty medications: No              Were doses missed due to medication being on hold? No      mycophenolate : MYHIBBIN  200 mg/mL suspension: 3 days of medicine on hand          REFERRAL TO PHARMACIST     Referral to the pharmacist: Not needed      [facility]     Shipping address confirmed in Epic.     Cost and Payment: Patient has a $0 copay, payment information is not required.    Delivery Scheduled: Yes, Expected medication delivery date: 7.21.25.     Medication will be delivered via Same Day Courier to the prescription address in Epic WAM.    Doyal Hurst   [facility] Specialty and Home Delivery Pharmacy  Specialty Technician

## 2023-12-15 NOTE — Unmapped (Signed)
 12/15/2023 - Clinical assessment date was due on 8.25.25. Reached out to Jessica McLemore and got approval to proceed with scheduling the patients refill and to move the clinical assessment date to match the next refill coordination.

## 2023-12-15 NOTE — Unmapped (Signed)
 [facility] Specialty and Home Delivery Pharmacy Refill Coordination Note    Specialty Medication(s) to be Shipped:   Transplant: tacrolimus  oral suspension 1mg /ml    Other medication(s) to be shipped: No additional medications requested for fill at this time     Michelle [name], DOB: 2005/08/19  Phone: [phone] (home)       All above HIPAA information was verified with patient.     Was a Nurse, learning disability used for this call? No    Completed refill call assessment today to schedule patient's medication shipment from the [facility] and Home Delivery Pharmacy  [phone]).  All relevant notes have been reviewed.     Specialty medication(s) and dose(s) confirmed: Regimen is correct and unchanged.   Changes to medications: Alexsis reports no changes at this time.  Changes to insurance: No  New side effects reported not previously addressed with a pharmacist or physician: None reported  Questions for the pharmacist: No    Confirmed patient received a Conservation officer, historic buildings and a Surveyor, mining with first shipment. The patient will receive a drug information handout for each medication shipped and additional FDA Medication Guides as required.       DISEASE/MEDICATION-SPECIFIC INFORMATION        N/A    SPECIALTY MEDICATION ADHERENCE     Medication Adherence    Patient reported X missed doses in the last month: 0  Specialty Medication: tacrolimus  1 mg/mL oral suspension (CAPS)  Patient is on additional specialty medications: No              Were doses missed due to medication being on hold? No      tacrolimus  1 mg/mL oral suspension (CAPS): 7 days of medicine on hand       REFERRAL TO PHARMACIST     Referral to the pharmacist: Not needed      [name]     Shipping address confirmed in Epic.     Cost and Payment: Patient has a $0 copay, payment information is not required.    Delivery Scheduled: Yes, Expected medication delivery date: 7.25.25.     Medication will be delivered via Same Day Courier to the prescription address in Epic WAM.    Doyal Hurst   [facility] Specialty and Home Delivery Pharmacy  Specialty Technician

## 2023-12-16 LAB — CBC W/ DIFFERENTIAL
BANDED NEUTROPHILS ABSOLUTE COUNT: 0 x10E3/uL (ref 0.0–0.1)
BASOPHILS ABSOLUTE COUNT: 0 x10E3/uL (ref 0.0–0.2)
BASOPHILS RELATIVE PERCENT: 1 %
EOSINOPHILS ABSOLUTE COUNT: 0.1 x10E3/uL (ref 0.0–0.4)
EOSINOPHILS RELATIVE PERCENT: 1 %
HEMATOCRIT: 30.5 % — ABNORMAL LOW (ref 34.0–46.6)
HEMOGLOBIN: 9.2 g/dL — ABNORMAL LOW (ref 11.1–15.9)
IMMATURE GRANULOCYTES: 0 %
LYMPHOCYTES ABSOLUTE COUNT: 0.7 x10E3/uL (ref 0.7–3.1)
LYMPHOCYTES RELATIVE PERCENT: 20 %
MEAN CORPUSCULAR HEMOGLOBIN CONC: 30.2 g/dL — ABNORMAL LOW (ref 31.5–35.7)
MEAN CORPUSCULAR HEMOGLOBIN: 24.2 pg — ABNORMAL LOW (ref 26.6–33.0)
MEAN CORPUSCULAR VOLUME: 80 fL (ref 79–97)
MONOCYTES ABSOLUTE COUNT: 0.3 x10E3/uL (ref 0.1–0.9)
MONOCYTES RELATIVE PERCENT: 9 %
NEUTROPHILS ABSOLUTE COUNT: 2.5 x10E3/uL (ref 1.4–7.0)
NEUTROPHILS RELATIVE PERCENT: 69 %
PLATELET COUNT: 259 x10E3/uL (ref 150–450)
RED BLOOD CELL COUNT: 3.8 x10E6/uL (ref 3.77–5.28)
RED CELL DISTRIBUTION WIDTH: 13.7 % (ref 11.7–15.4)
WHITE BLOOD CELL COUNT: 3.6 x10E3/uL (ref 3.4–10.8)

## 2023-12-16 LAB — RENAL FUNCTION PANEL
ALBUMIN: 4.4 g/dL (ref 4.0–5.0)
BLOOD UREA NITROGEN: 18 mg/dL (ref 6–20)
BUN / CREAT RATIO: 14 (ref 9–23)
CALCIUM: 9.8 mg/dL (ref 8.7–10.2)
CHLORIDE: 105 mmol/L (ref 96–106)
CO2: 19 mmol/L — ABNORMAL LOW (ref 20–29)
CREATININE: 1.29 mg/dL — ABNORMAL HIGH (ref 0.57–1.00)
EGFR: 61 mL/min/1.73
GLUCOSE: 90 mg/dL (ref 70–99)
PHOSPHORUS, SERUM: 3.2 mg/dL — ABNORMAL LOW (ref 3.3–5.1)
POTASSIUM: 4.5 mmol/L (ref 3.5–5.2)
SODIUM: 139 mmol/L (ref 134–144)

## 2023-12-18 MED FILL — MYHIBBIN 200 MG/ML ORAL SUSPENSION: ORAL | 34 days supply | Qty: 175 | Fill #4

## 2023-12-19 LAB — TACROLIMUS LEVEL: TACROLIMUS BLOOD: 5.6 ng/mL (ref 5.0–20.0)

## 2023-12-19 NOTE — Unmapped (Signed)
 Transplant Lab Follow-Up    Patient was called to discuss laboratory results. Tacrolimus  level was within goal range at 5.6 ng/mL (goal 5-7 ng/mL). Plan to continue current tacrolimus  (Prograf ) dose of 0.8 mg every 12 hours. Patient will get repeat labs in 1 month. Plan to follow-up with other labwork as results made available. All questions answered. Patient verbalized understanding & agreed with the plan.     Laboratory Results Summary     Immunosuppression  Administration Times: 0800/2000  Current Regimen: Tacrolimus  0.8 mg every 12 hours  NEW Regimen: Tacrolimus  0.8 mg every 12 hours  Trough Goal: 5-7 ng/mL    Lab Results   Component Value Date    TACROLIMUS  5.6 12/15/2023    TACROLIMUS  8.4 11/29/2023    TACROLIMUS  8.2 11/01/2023     Infectious Disease/Viral Testing    Lab Results   Component Value Date    CMV Viral Ld Not Detected 09/26/2023     Lab Results   Component Value Date    CMV Quant Negative 06/06/2023     Other Laboratory Results  Lab Results   Component Value Date    BUN 18 12/15/2023    CREATININE 1.29 (H) 12/15/2023    K 4.5 12/15/2023    GLU 101 (H) 09/26/2023    MG 1.5 (L) 09/26/2023     Lab Results   Component Value Date    WBC 3.6 12/15/2023    HGB 9.2 (L) 12/15/2023    HCT 30.5 (L) 12/15/2023    PLT 259 12/15/2023    NEUTROABS 2.5 12/15/2023       Milly FABIENE Moder, PharmD, BCPPS, CPP  Clinical Pharmacist Practitioner - Pediatric Solid Organ Transplant

## 2023-12-21 DIAGNOSIS — N189 Chronic kidney disease, unspecified: Principal | ICD-10-CM

## 2023-12-21 DIAGNOSIS — D631 Anemia in chronic kidney disease: Principal | ICD-10-CM

## 2024-01-09 LAB — CBC W/ DIFFERENTIAL
BANDED NEUTROPHILS ABSOLUTE COUNT: 0 x10E3/uL (ref 0.0–0.1)
BASOPHILS ABSOLUTE COUNT: 0 x10E3/uL (ref 0.0–0.2)
BASOPHILS RELATIVE PERCENT: 1 %
EOSINOPHILS ABSOLUTE COUNT: 0.1 x10E3/uL (ref 0.0–0.4)
EOSINOPHILS RELATIVE PERCENT: 1 %
HEMATOCRIT: 31.1 % — ABNORMAL LOW (ref 34.0–46.6)
HEMOGLOBIN: 9.3 g/dL — ABNORMAL LOW (ref 11.1–15.9)
IMMATURE GRANULOCYTES: 0 %
LYMPHOCYTES ABSOLUTE COUNT: 0.8 x10E3/uL (ref 0.7–3.1)
LYMPHOCYTES RELATIVE PERCENT: 20 %
MEAN CORPUSCULAR HEMOGLOBIN CONC: 29.9 g/dL — ABNORMAL LOW (ref 31.5–35.7)
MEAN CORPUSCULAR HEMOGLOBIN: 24.2 pg — ABNORMAL LOW (ref 26.6–33.0)
MEAN CORPUSCULAR VOLUME: 81 fL (ref 79–97)
MONOCYTES ABSOLUTE COUNT: 0.4 x10E3/uL (ref 0.1–0.9)
MONOCYTES RELATIVE PERCENT: 9 %
NEUTROPHILS ABSOLUTE COUNT: 2.9 x10E3/uL (ref 1.4–7.0)
NEUTROPHILS RELATIVE PERCENT: 69 %
PLATELET COUNT: 297 x10E3/uL (ref 150–450)
RED BLOOD CELL COUNT: 3.85 x10E6/uL (ref 3.77–5.28)
RED CELL DISTRIBUTION WIDTH: 13.2 % (ref 11.7–15.4)
WHITE BLOOD CELL COUNT: 4.2 x10E3/uL (ref 3.4–10.8)

## 2024-01-09 LAB — RENAL FUNCTION PANEL
ALBUMIN: 4.3 g/dL (ref 4.0–5.0)
BLOOD UREA NITROGEN: 22 mg/dL — ABNORMAL HIGH (ref 6–20)
BUN / CREAT RATIO: 15 (ref 9–23)
CALCIUM: 9.4 mg/dL (ref 8.7–10.2)
CHLORIDE: 111 mmol/L — ABNORMAL HIGH (ref 96–106)
CO2: 17 mmol/L — ABNORMAL LOW (ref 20–29)
CREATININE: 1.48 mg/dL — ABNORMAL HIGH (ref 0.57–1.00)
EGFR: 52 mL/min/1.73 — ABNORMAL LOW
GLUCOSE: 93 mg/dL (ref 70–99)
PHOSPHORUS, SERUM: 3.7 mg/dL (ref 3.0–4.3)
POTASSIUM: 4.5 mmol/L (ref 3.5–5.2)
SODIUM: 146 mmol/L — ABNORMAL HIGH (ref 134–144)

## 2024-01-10 LAB — TACROLIMUS LEVEL: TACROLIMUS BLOOD: 7.2 ng/mL (ref 5.0–20.0)

## 2024-01-10 NOTE — Unmapped (Unsigned)
 error Assessment:    Do you feel the medicine is effective or helping your condition? {Blank:19197::Yes,No,Patient declined to answer}    Clinical Benefit counseling provided? {Blank:19197::Not needed,Reasonable expectations discussed: ***,Labs from *** show evidence of clinical benefit,Progress note from *** shows evidence of clinical benefit,consulted provider regarding clinical benefit concerns,***}    Adverse Effects Assessment:    Are you experiencing any side effects? {Blank:19197::Yes, patient reports experiencing ***. See clinical intervention documentation: ***,No}    Are you experiencing difficulty administering your medicine? {Blank:19197::Yes, patient reports ***. See clinical intervention documentation,No}    Quality of Life Assessment:    Quality of Life    Rheumatology  Oncology  Dermatology  Cystic Fibrosis          {DiseaseSpecificQOL:73897}    Have you discussed this with your provider? {Blank:19197::Not needed,Yes,No - pharmacist will consult provider, see clinical intervention documentation}    Acute Infection Status:    Acute infections noted within Epic:  No active infections    Patient reported infection: {Blank single:19197::None,***- patient reported to provider,***- pharmacy reported to provider, see clinical intervention documentation}    Therapy Appropriateness:    Is therapy appropriate based on current medication list, adverse reactions, adherence, clinical benefit and progress toward achieving therapeutic goals? {Blank:19197::Yes, therapy is appropriate and should be continued,Pharmacist will consult provider - see clinical intervention documentation}     Clinical Intervention:    Was an intervention completed as part of this clinical assessment? {INTERVENTIONADDITION:117563}    DISEASE/MEDICATION-SPECIFIC INFORMATION      {clinicspecificinstructions:59274}    {DISEASESTATESPECIFICASSESSMENT:98894}    PATIENT SPECIFIC NEEDS     Does the patient have any physical, cognitive, or cultural barriers? {Blank single:19197::No,Yes - ***}    Is the patient high risk? {sschighriskpts:78327}    Does the patient require physician intervention or other additional services (i.e., nutrition, smoking cessation, social work)? {Blank single:19197::No,Yes, ***}    Does the patient have an additional or emergency contact listed in their chart? {Blank single:19197::Yes,No, patient refused.,***}    SOCIAL DETERMINANTS OF HEALTH     At the [facility], we have learned that life circumstances - like trouble affording food, housing, utilities, or transportation can affect the health of many of our patients.   That is why we wanted to ask: are you currently experiencing any life circumstances that are negatively impacting your health and/or quality of life? {YES/NO/PATIENTDECLINED:93004}    Social Drivers of Health     Food Insecurity: No Food Insecurity (04/14/2021)    Hunger Vital Sign     Worried About Running Out of Food in the Last Year: Never true     Ran Out of Food in the Last Year: Never true   Tobacco Use: Low Risk  (09/04/2023)    Patient History     Smoking Tobacco Use: Never     Smokeless Tobacco Use: Never     Passive Exposure: Never   Transportation Needs: No Transportation Needs (04/14/2021)    PRAPARE - Transportation     Lack of Transportation (Medical): No     Lack of Transportation (Non-Medical): No   Alcohol Use: Not At Risk (06/17/2021)    Alcohol Use     How often do you have a drink containing alcohol?: Never     How many drinks containing alcohol do you have on a typical day when you are drinking?: Not on file     How often do you have 5 or more drinks on one occasion?: Never   Housing: Not  on file   Physical Activity: Not on file   Utilities: Not on file   Stress: Not on file   Interpersonal Safety: Not At Risk (09/04/2023)    Interpersonal Safety     Unsafe Where You Currently Live: No     Physically Hurt by Anyone: No     Abused by Anyone: No Substance Use: Not on file (04/06/2023)   Intimate Partner Violence: Not on file   Social Connections: Not on file   Financial Resource Strain: Low Risk  (04/14/2021)    Overall Financial Resource Strain (CARDIA)     Difficulty of Paying Living Expenses: Not very hard   Health Literacy: Not on file   Internet Connectivity: Not on file       Would you be willing to receive help with any of the needs that you have identified today? {Yes/No/Not applicable:93005}       SHIPPING     Specialty Medication(s) to be Shipped:   {specpharm:59087}    Other medication(s) to be shipped: {Blank:19197::***,No additional medications requested for fill at this time}    Specialty Medications not needed at this time: {specpharm2:122626}     Changes to insurance: {Blank:19197::Yes: ***,No}    Cost and Payment: {Blank single:19197::Patient has a $0 copay, payment information is not required.,Patient has a copay of $***. They are aware and have authorized the pharmacy to charge the credit card on file.,Unable to determine copay at this time as the prescription requires a prior authorization/financial assistance. Patient is aware that shipment will be held until copay has been approved and payment information collected, if needed.}    Delivery Scheduled: {Blank:19197::Yes, Expected medication delivery date: ***.,Yes, Expected medication delivery date: ***.  However, Rx request for refills was sent to the provider as there are none remaining.,Patient declined refill at this time due to ***.,No, cannot schedule delivery at this time as there are outstanding items that need addressed.  This note has been handed off to the provider for follow up.,Due to patient insurance changes, unable to fill at [facility], please route Rx to *** specialty pharmacy}     Medication will be delivered via {Blank:19197::UPS,Next Day Courier,Same Day Courier,Clinic Courier - *** clinic,***} to the confirmed {Blank:19197::prescription,temporary} address in [facility].    The patient will receive a drug information handout for each medication shipped and additional FDA Medication Guides as required.  Verified that patient has previously received a Conservation officer, historic buildings and a Surveyor, mining.    The patient or caregiver noted above participated in the development of this care plan and knows that they can request review of or adjustments to the care plan at any time.      All of the patient's questions and concerns have been addressed.    Harlene Gal, PharmD   [facility] Specialty Pharmacist       [1]   Current Outpatient Medications   Medication Sig Dispense Refill    amlodipine  (NORVASC ) 5 MG tablet Take 1 tablet (5 mg total) by mouth daily. 30 tablet 11    carvedilol  (COREG ) 25 MG tablet Take 1 tablet (25 mg total) by mouth Two (2) times a day. 60 tablet 8    mycophenolate  (MYHIBBIN ) 200 mg/mL suspension Take 2.3 mL by mouth two (2) times a day.   Discard 60 days after opening. 175 mL 11    sumatriptan  (IMITREX ) 20 mg/actuation nasal spray 1 spray (20 mg total) into alternating nostrils once as needed  for migraine. May repeat dose in 2 hours if needed. No more than 2 sprays per day. 1 each PRN    tacrolimus  1 mg/mL oral suspension (CAPS) Take 0.8 mL (0.8 mg total) by mouth two (2) times a day. 48 mL 11     No current facility-administered medications for this visit.   [2]   Allergies  Allergen Reactions    Latex      Added based on information entered during case entry, please review and add reactions, type, and severity as needed

## 2024-01-10 NOTE — Unmapped (Signed)
 Transplant Lab Follow-Up    Patient was called to discuss laboratory results. Tacrolimus  level was within goal range at 7.2 ng/mL (goal 5-7 ng/mL). Plan to continue current tacrolimus  (Prograf ) dose of 0.8 mg every 12 hours. Patient will get repeat labs in 1 month. Plan to follow-up with other labwork as results made available. All questions answered. Patient verbalized understanding & agreed with the plan.     Laboratory Results Summary     Immunosuppression  Administration Times: 0800/2000  Current Regimen: Tacrolimus  0.8 mg every 12 hours  NEW Regimen: Tacrolimus  0.8 mg every 12 hours  Trough Goal: 5-7 ng/mL    Lab Results   Component Value Date    TACROLIMUS  7.2 01/08/2024    TACROLIMUS  5.6 12/15/2023    TACROLIMUS  8.4 11/29/2023     Infectious Disease/Viral Testing    Lab Results   Component Value Date    CMV Viral Ld Not Detected 09/26/2023     Lab Results   Component Value Date    CMV Quant Negative 06/06/2023     Other Laboratory Results  Lab Results   Component Value Date    BUN 22 (H) 01/08/2024    CREATININE 1.48 (H) 01/08/2024    K 4.5 01/08/2024    GLU 101 (H) 09/26/2023    MG 1.5 (L) 09/26/2023     Lab Results   Component Value Date    WBC 4.2 01/08/2024    HGB 9.3 (L) 01/08/2024    HCT 31.1 (L) 01/08/2024    PLT 297 01/08/2024    NEUTROABS 2.9 01/08/2024       Milly FABIENE Moder, PharmD, BCPPS, CPP  Clinical Pharmacist Practitioner - Pediatric Solid Organ Transplant

## 2024-01-15 ENCOUNTER — Ambulatory Visit: Admit: 2024-01-15 | Discharge: 2024-01-16 | Payer: Medicaid (Managed Care)

## 2024-01-15 DIAGNOSIS — D849 Immunodeficiency, unspecified: Principal | ICD-10-CM

## 2024-01-15 DIAGNOSIS — D631 Anemia in chronic kidney disease: Principal | ICD-10-CM

## 2024-01-15 DIAGNOSIS — N182 Chronic kidney disease, stage 2 (mild): Principal | ICD-10-CM

## 2024-01-15 DIAGNOSIS — N189 Chronic kidney disease, unspecified: Principal | ICD-10-CM

## 2024-01-15 DIAGNOSIS — Z940 Kidney transplant status: Principal | ICD-10-CM

## 2024-01-15 LAB — RENAL FUNCTION PANEL
ALBUMIN: 4.1 g/dL (ref 3.4–5.0)
ANION GAP: 12 mmol/L (ref 7–15)
BLOOD UREA NITROGEN: 21 mg/dL — ABNORMAL HIGH (ref 7–18)
BUN / CREAT RATIO: 17
CALCIUM: 9.4 mg/dL (ref 8.5–10.1)
CHLORIDE: 106 mmol/L (ref 97–107)
CO2: 24.1 mmol/L (ref 21.0–32.0)
CREATININE: 1.25 mg/dL — ABNORMAL HIGH (ref 0.51–0.95)
EGFR CKD-EPI (2021) FEMALE: 63 mL/min/1.73m2 (ref >=60)
GLUCOSE RANDOM: 96 mg/dL (ref 70–179)
PHOSPHORUS: 3.2 mg/dL (ref 2.6–4.7)
POTASSIUM: 4.4 mmol/L (ref 3.3–4.7)
SODIUM: 142 mmol/L (ref 135–145)

## 2024-01-15 LAB — CBC W/ AUTO DIFF
BASOPHILS ABSOLUTE COUNT: 0 10*9/L (ref 0.0–0.1)
BASOPHILS RELATIVE PERCENT: 0.6 %
EOSINOPHILS ABSOLUTE COUNT: 0 10*9/L (ref 0.0–0.5)
EOSINOPHILS RELATIVE PERCENT: 0.9 %
HEMATOCRIT: 28 % — ABNORMAL LOW (ref 34.0–44.0)
HEMOGLOBIN: 9.2 g/dL — ABNORMAL LOW (ref 11.3–14.9)
LYMPHOCYTES ABSOLUTE COUNT: 0.7 10*9/L — ABNORMAL LOW (ref 1.1–3.6)
LYMPHOCYTES RELATIVE PERCENT: 16.6 %
MEAN CORPUSCULAR HEMOGLOBIN CONC: 32.7 g/dL (ref 32.3–35.0)
MEAN CORPUSCULAR HEMOGLOBIN: 24 pg — ABNORMAL LOW (ref 25.9–32.4)
MEAN CORPUSCULAR VOLUME: 73.3 fL — ABNORMAL LOW (ref 77.6–95.7)
MEAN PLATELET VOLUME: 10 fL (ref 7.3–10.7)
MONOCYTES ABSOLUTE COUNT: 0.3 10*9/L (ref 0.3–0.8)
MONOCYTES RELATIVE PERCENT: 7.7 %
NEUTROPHILS ABSOLUTE COUNT: 3.2 10*9/L (ref 1.5–6.4)
NEUTROPHILS RELATIVE PERCENT: 74.2 %
PLATELET COUNT: 248 10*9/L (ref 170–380)
RED BLOOD CELL COUNT: 3.82 10*12/L — ABNORMAL LOW (ref 3.95–5.13)
RED CELL DISTRIBUTION WIDTH: 14.4 % (ref 12.2–15.2)
WBC ADJUSTED: 4.3 10*9/L (ref 4.2–10.2)

## 2024-01-15 LAB — SLIDE REVIEW

## 2024-01-15 LAB — IRON PANEL
IRON SATURATION: 7 % — ABNORMAL LOW (ref 20–55)
IRON: 29 ug/dL — ABNORMAL LOW (ref 50–170)
TOTAL IRON BINDING CAPACITY: 436 ug/dL (ref 250–450)

## 2024-01-15 LAB — FERRITIN: FERRITIN: 6.3 ng/mL — ABNORMAL LOW (ref 8.0–252.0)

## 2024-01-15 LAB — MAGNESIUM: MAGNESIUM: 1.7 mg/dL — ABNORMAL LOW (ref 1.8–2.4)

## 2024-01-15 LAB — PREGNANCY, URINE: PREGNANCY TEST URINE: NEGATIVE

## 2024-01-15 MED ORDER — FERROUS SULFATE 324 MG (65 MG IRON) TABLET,DELAYED RELEASE
ORAL_TABLET | ORAL | 11 refills | 30.00000 days | Status: CP
Start: 2024-01-15 — End: ?

## 2024-01-15 MED ORDER — AMLODIPINE 2.5 MG TABLET
ORAL_TABLET | Freq: Every day | ORAL | 11 refills | 30.00000 days | Status: CP
Start: 2024-01-15 — End: ?

## 2024-01-15 NOTE — Unmapped (Signed)
 [facility] Pediatric Transplant Pharmacist Visit      Visit Summary  Follow-Up:  Viral Titers (BK, CMV, EBV)  HLA DSA Post-Transplant  Allosure  Vitamin D   PTH    Subjective     Michelle [name] is a 18 y.o. 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 Michelle [name] reports that everything is going well and has no acute concerns for today's visit.    Interval History:  Illness: None  Swelling: None  Missed Doses: None    Transplant Information:  KDPI: 15%  CMV Status: Moderate Risk (D+/R+)  EBV Status: Moderate Risk (D+/R+)  Induction Agent: Alemtuzumab (Campath)    Rejection Episodes:  None    Laboratory Values     Tacrolimus : Goal trough: 5-7 ng/mL; Takes at 0800/2000  Lab Results   Component Value Date    TACROLIMUS  7.2 01/08/2024    TACROLIMUS  5.6 12/15/2023    TACROLIMUS  8.4 11/29/2023     Chemistry:   Lab Results   Component Value Date    NA 146 (H) 01/08/2024    K 4.5 01/08/2024    CL 111 (H) 01/08/2024    CO2 17 (L) 01/08/2024    BUN 22 (H) 01/08/2024    CREATININE 1.48 (H) 01/08/2024    GLU 101 (H) 09/26/2023    CALCIUM  9.4 01/08/2024    MG 1.5 (L) 09/26/2023    MG 1.8 04/06/2023    PHOS 3.8 09/26/2023      Hemoglobin A1c:   Lab Results   Component Value Date    A1C 5.3 07/24/2023     Liver Function Tests:   Lab Results   Component Value Date    AST 36 (H) 07/24/2023    AST 18 06/17/2022    ALT 32 07/24/2023    ALT <7 (L) 06/17/2022    ALKPHOS 71 06/17/2022    ALKPHOS 70 08/02/2021    GGT 10 04/20/2021    ALBUMIN 4.2 09/26/2023    ALBUMIN 4.3 03/06/2023     CBC:   Lab Results   Component Value Date    WBC 4.2 01/08/2024    WBC 3.6 12/15/2023    HGB 9.3 (L) 01/08/2024    HGB 9.2 (L) 12/15/2023    HCT 31.1 (L) 01/08/2024    HCT 30.5 (L) 12/15/2023    PLT 297 01/08/2024    PLT 259 12/15/2023    NEUTROABS 2.9 01/08/2024    NEUTROABS 2.5 12/15/2023    LYMPHSABS 0.8 01/08/2024    LYMPHSABS 0.7 12/15/2023 Vitamin D  Levels: Goal > 20  Lab Results   Component Value Date    VITDTOTAL 25.4 07/24/2023    VITDTOTAL 32.3 11/07/2022    VITDTOTAL 30.4 06/17/2022     Iron  Studies:  Lab Results   Component Value Date    IRON  62 05/10/2023    TIBC 354 05/10/2023    FERRITIN 9.1 03/06/2023     Lab Results   Component Value Date    Iron  Saturation (%) 18 05/10/2023     Infectious Disease:  CMV:   Lab Results   Component Value Date    CMVLR Not Detected 09/26/2023    CMVLR Not Detected 07/24/2023    CMVLR Not Detected 03/06/2023    CMVLR Not Detected 11/07/2022    CMVLR Not Detected 11/07/2022     Lab Results   Component Value Date    CMVCP Negative 06/06/2023  CMVCP Negative 04/06/2023    CMVCP Negative 01/31/2023    CMVCP Negative 01/03/2023     Medication Review     All medications were updated in EPIC medication profile, and any medications not currently part of prescribed medication regimen have been discontinued from the medication profile.     Immunosuppression:  Calcineurin Inhibitor: Tacrolimus   Trough Goal: 5-7 ng/mL  Dose: 0.8 mg every 12 hours  Dosage Form: Oral Suspension  Route: Oral  Anti-proliferative: Mycophenolate  Mofetil (Cellcept )  Dose: 460 mg (255 mg/m2) twice daily  Dosage Form: Oral Suspension  Route: Oral    Cardiovascular:  Goal Blood Pressure: < 130/80 mmHg  Hypertension Regimen:   Amlodipine  5 mg oral tablet - 1 tablet (5 mg) by mouth daily - NOT TAKING  Carvedilol  25 mg oral tablet - 1 tablet (25 mg) by mouth twice daily    Endocrine:  History of diabetes mellitus prior to transplant: No  Diagnosed with new onset diabetes after transplant (NODAT): No    Gynecology:   Patient is a female of childbearing age/status.     Assessment/Recommendations     Immunosuppression  Calcineurin Inhibitor: Tacrolimus  0.8 mg every 12 hours  Trough is within goal.  Adverse effect assessment: Patient is tolerating immunosuppression well and reports no concerning adverse effects.  Antiproliferative: Mycophenolate  Mofetil (Cellcept ) 460 mg (255 mg/m2) twice daily  Adverse effect assessment: Patient is tolerating immunosuppression well and reports no concerning adverse effects at this time.  Recommendation: Continue current dose and monitor for adverse effects    Cardiovascular  Hypertension  Assessment: Blood pressure reading was within goal.   Recommendations: Continue current regimen of amlodipine  and carvedilol , and monitor for lightheadedness and dizziness    FEN/GI  Electrolyte Management  Potassium is within normal limits.    Magnesium  is within normal limits.    Phosphorus is within normal limits.      Medication Adherence  Patient has good understanding of medications and was able to independently identify names/doses of immunosuppressants and other medications.     Access: No issues noted in obtaining medications    Preferred Pharmacy: [facility]    Prescription Renewals: None    Michelle [name], PharmD, CPP  Clinical Pharmacist Practitioner - Pediatric Solid Organ Transplant    I spent a total of 15 minutes on the face-to-face visit with the patient delivering clinical care and providing education/counseling.

## 2024-01-15 NOTE — Unmapped (Signed)
 Met with pt in clinic today with herself  Donor (deceased) CMV D+/R+  EBV D+/R+     Most recent labs available for review:  Current complaints or concerns:        Fever/cold/flu symptoms no  Headache/Dizziness/Lightheaded: no  Hand tremors: no  Numbness/tingling: no  Fevers/chills/sweats: no  CP/SOB/palpatations: no  Nausea/vomiting/heartburn: no  Diarrhea/constipation: no  UTI symptoms (burn/pain/itch/frequency/urgency/odor/color/foam): no    Admissions since last clinic visit: no  Rejection or infection since last clinic visit:  no  Knowledge of meds, doses, frequency: not taking norvasc , knows her other meds.  Fluid intake: needs to improve, doesn't drink much on days she works, will work on improving it.    School progress/ working(Grade level, grades, missing days): working  Social screening (STDs, drugs, free time activities, games, phone time)  just works in Engineering geologist.  Sleep schedule: no changes  Appetite:  good    Prescriptions needing refill or updates: no    Virals/DSAs updated: UTD  Annual screening (txp RUS/native, CXR, EKG/echo)  Lab frequency: monthly  Lab orders last updated: good at this time.    Return to peds neph clinic: 93mo   Referral to other providers: none  Transition readiness: yes, initial visit this past Feb 07/28/23    Birth control no and not sexually active

## 2024-01-15 NOTE — Unmapped (Signed)
 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 Allosure was great on 12/09/21 at 0.28.   Creatinine 07/04/23: 1.40 (range 1-1.5).   Urinalysis 04/10/23: 1+ blood, always has a little bit of blood.   DSA: None, last checked 07/24/23  Allosure:   0.28 12/09/21  0.21 11/07/22  0.24 03/06/23  0.22 07/24/23  Due today     # Immunosuppression:   - Prograf  goal 5-7  - MMF 460mg  BID, AUC done 4 weeks post transplant  - Changes in Immunosuppression: none  - Medications side effects: none     # HTN - not well controlled, diastolic hypertension  - B.P - goal is 110/65 (50th percentile).   - Continue carvedilol  25mg  BID.   - Hadn't added amlodipine  as I intended in April, adding today at 2.5mg  nightly   - Will get her a replacement machine       # Anemia - not at goal, symptomatic with pica  Goal for Hemoglobin >10, currently at goal  Iron  sat was 18%, repeating today  Will restart ferrous sulfate  every other day     # Infectious disease  EBV D+/R+, CMV D+/R+  All three viruses negative 07/24/23  Due today     # MBD - Calcium /Phosphorus   PTH 06/15/21: 69.1      # Electrolytes:   No issues.      # Immunizations:   Immunization History   Administered Date(s) Administered    COVID-19 VAC,BIVALENT(40YR UP),PFIZER 04/15/2021    COVID-19 VACC,MRNA,(PFIZER)(PF) 01/16/2020, 02/06/2020    Covid-19 Vac, (27yr+) (Comirnaty) Mrna Pfizer  04/10/2023    DTaP / IPV 01/16/2008    DTaP, Unspecified Formulation 02/17/2004, 06/21/2004, 08/02/2004, 04/19/2005    HPV Quadrivalent (Gardasil) 01/09/2015, 03/27/2015, 07/24/2015    Hepatitis A Vaccine - Unspecified Formulation 01/17/2005, 07/25/2005    Hepatitis B Vaccine, Unspecified Formulation Mar 27, 2004, 02/17/2004, 08/02/2004    Hepatitis B vaccine, pediatric/adolescent dosage, 04/15/2021    HiB, unspecified 02/17/2004, 06/21/2004, 08/02/2004    HiB-PRP-OMP 04/19/2005    Influenza Vaccine Quad(IM)6 MO-Adult(PF) 04/09/2021    MENINGOCOCCAL VACCINE, A,C,Y, W-135(IM)(MENVEO) 10/10/2020    MMR 01/17/2005, 01/16/2008    Meningococcal B Vaccine, OMV Adjuvanted(Bexsero) 10/10/2020    Meningococcal Conjugate MCV4P 01/09/2015    Pneumococcal Conjugate 13-Valent 04/15/2021    Pneumococcal conjugate -PCV7 02/17/2004, 06/21/2004, 08/02/2004, 01/17/2005    Polio Virus Vaccine, Unspecified Formulation 02/17/2004, 06/21/2004, 04/19/2005    TdaP 01/09/2015    Varicella 01/17/2005, 01/16/2008       Discharge Medications:     Current Outpatient Medications   Medication Sig Dispense Refill    carvedilol  (COREG ) 25 MG tablet Take 1 tablet (25 mg total) by mouth Two (2) times a day. 60 tablet 8    mycophenolate  (MYHIBBIN ) 200 mg/mL suspension Take 2.3 mL by mouth two (2) times a day.   Discard  60 days after opening. 175 mL 11    sumatriptan  (IMITREX ) 20 mg/actuation nasal spray 1 spray (20 mg total) into alternating nostrils once as needed for migraine. May repeat dose in 2 hours if needed. No more than 2 sprays per day. 1 each PRN    tacrolimus  1 mg/mL oral suspension (CAPS) Take 0.8 mL (0.8 mg total) by mouth two (2) times a day. 48 mL 11    amlodipine  (NORVASC ) 2.5 MG tablet Take 1 tablet (2.5 mg total) by mouth daily. 30 tablet 11    ferrous sulfate  324 mg (65 mg iron ) EC tablet Take 1 tablet (324 mg total) by mouth every other day. 15 tablet 11     No current facility-administered medications for this visit.     I personally spent 45 minutes face-to-face and non-face-to-face in the care of this patient, which includes all pre, intra, and post visit time on the date of service.    Subjective:     Michelle [name] is a 18 y.o. 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      Biopsy 06/17/21 for high creatinine when it was 1.6-1.8 without explanation showed no rejection, maybe some calcineurin inhibitor toxicity, acute tubular injury.      Creatinine has never been as low as expected, but allosures have all been reassuring.      Still working in Chief Strategy Officer at [employer]. Lives at home with her parents. Not dating.    Insurance still medicaid.     Review of Systems: ten systems reviewed and negative but for that noted in HPI      Medications:     Current Outpatient Medications   Medication Sig Dispense Refill    carvedilol  (COREG ) 25 MG tablet Take 1 tablet (25 mg total) by mouth Two (2) times a day. 60 tablet 8    mycophenolate  (MYHIBBIN ) 200 mg/mL suspension Take 2.3 mL by mouth two (2) times a day.   Discard 60 days after opening. 175 mL 11    sumatriptan  (IMITREX ) 20 mg/actuation nasal spray 1 spray (20 mg total) into alternating nostrils once as needed for migraine. May repeat dose in 2 hours if needed. No more than 2 sprays per day. 1 each PRN    tacrolimus  1 mg/mL oral suspension (CAPS) Take 0.8 mL (0.8 mg total) by mouth two (2) times a day. 48 mL 11    amlodipine  (NORVASC ) 2.5 MG tablet Take 1 tablet (2.5 mg total) by mouth daily. 30 tablet 11    ferrous sulfate  324 mg (65 mg iron ) EC tablet Take 1 tablet (324 mg total) by mouth every other day. 15 tablet 11     No current facility-administered medications for this visit.       Allergies:     Allergies   Allergen Reactions    Latex      Added based on information entered during case entry, please review and add reactions, type, and severity as needed       Past Medical History:     Past Medical History:   Diagnosis Date    Hyperkalemia 12/17/2020    Hypertension 12/11/2020    Secondary hyperparathyroidism (HHS-HCC) 12/17/2020       Objective:     PE:   BP 128/89 (BP Site: R Arm,  BP Position: Sitting, BP Cuff Size: Large)  - Pulse 90  - Temp 36.6 ??C (97.9 ??F)  - Ht 158.8 cm (5' 2.52)  - Wt 73.7 kg (162 lb 7.7 oz)  - LMP 01/01/2024 (Exact Date)  - BMI 29.23 kg/m??   Facility age limit for growth %iles is 20 years.  Facility age limit for growth %iles is 20 years.  Growth %ile SmartLinks can only be used for patients less than 7 years old.  Facility age limit for growth %iles is 20 years.    General Appearance:  Healthy-appearing, well nourished, alert, interactive  HEENT: Sclerae white, EOMI, moist mucous membranes  Pulm: Normal RR and WOB   Renal:  Extremities without edema  Neuro: Alert; normal tone throughout        Labs:   Recent Results (from the past 4 weeks)   Renal Function Panel    Collection Time: 01/08/24  8:15 AM   Result Value Ref Range    Glucose 93 70 - 99 mg/dL    BUN 22 (H) 6 - 20 mg/dL    Creatinine 8.51 (H) 0.57 - 1.00 mg/dL    eGFR 52 (L) >40 fO/fpw/8.26    BUN/Creatinine Ratio 15 9 - 23    Sodium 146 (H) 134 - 144 mmol/L    Potassium 4.5 3.5 - 5.2 mmol/L    Chloride 111 (H) 96 - 106 mmol/L    CO2 17 (L) 20 - 29 mmol/L    Calcium  9.4 8.7 - 10.2 mg/dL    Phosphorus, Serum 3.7 3.0 - 4.3 mg/dL    Albumin 4.3 4.0 - 5.0 g/dL   CBC w/ Differential    Collection Time: 01/08/24  8:15 AM   Result Value Ref Range    WBC 4.2 3.4 - 10.8 x10E3/uL    RBC 3.85 3.77 - 5.28 x10E6/uL    HGB 9.3 (L) 11.1 - 15.9 g/dL    HCT 68.8 (L) 65.9 - 46.6 %    MCV 81 79 - 97 fL    MCH 24.2 (L) 26.6 - 33.0 pg    MCHC 29.9 (L) 31.5 - 35.7 g/dL    RDW 86.7 88.2 - 84.5 %    Platelet 297 150 - 450 x10E3/uL    Neutrophils % 69 Not Estab. %    Lymphocytes % 20 Not Estab. %    Monocytes % 9 Not Estab. %    Eosinophils % 1 Not Estab. % Basophils % 1 Not Estab. %    Absolute Neutrophils 2.9 1.4 - 7.0 x10E3/uL    Absolute Lymphocytes 0.8 0.7 - 3.1 x10E3/uL    Absolute Monocytes  0.4 0.1 - 0.9 x10E3/uL    Absolute Eosinophils 0.1 0.0 - 0.4 x10E3/uL    Absolute Basophils  0.0 0.0 - 0.2 x10E3/uL    Immature Granulocytes 0 Not Estab. %    Bands Absolute 0.0 0.0 - 0.1 x10E3/uL   Tacrolimus  level    Collection Time: 01/08/24  8:15 AM   Result Value Ref Range    Tacrolimus  Lvl 7.2 5.0 - 20.0 ng/mL   POCT Urinalysis Dipstick    Collection Time: 01/15/24 12:31 PM   Result Value Ref Range    Spec Gravity/POC 1.020 1.003 - 1.030    PH/POC 6.0 5.0 - 9.0    Leuk Esterase/POC Negative Negative    Nitrite/POC Negative Negative    Protein/POC 1+ (A) Negative    UA Glucose/POC Negative Negative    Ketones, POC Negative Negative    Bilirubin/POC  Negative Negative    Blood/POC Trace-intact (A) Negative    Urobilinogen/POC 0.2 0.2 - 1.0 mg/dL

## 2024-01-16 LAB — BK VIRUS QUANTITATIVE PCR, BLOOD: BK BLOOD RESULT: NOT DETECTED

## 2024-01-16 LAB — EBV QUANTITATIVE PCR, BLOOD: EBV VIRAL LOAD RESULT: NOT DETECTED

## 2024-01-16 LAB — CMV DNA, QUANTITATIVE, PCR: CMV VIRAL LD: NOT DETECTED

## 2024-01-16 LAB — TACROLIMUS LEVEL, TROUGH: TACROLIMUS, TROUGH: 6.1 ng/mL (ref 5.0–15.0)

## 2024-01-16 NOTE — Unmapped (Signed)
 [facility] Specialty and Home Delivery Pharmacy Refill Coordination Note    Specialty Medication(s) to be Shipped:   Transplant: tacrolimus  oral suspension 1mg /ml    Other medication(s) to be shipped: No additional medications requested for fill at this time    Meghan  gave  permission  to  reschedule  clinical      Michelle [name], DOB: [birth year]  Phone: [phone] (home)       All above HIPAA information was verified with patient.     Was a Nurse, learning disability used for this call? No    Completed refill call assessment today to schedule patient's medication shipment from the [facility] and Home Delivery Pharmacy  [phone]).  All relevant notes have been reviewed.     Specialty medication(s) and dose(s) confirmed: Regimen is correct and unchanged.   Changes to medications: Danae reports no changes at this time.  Changes to insurance: No  New side effects reported not previously addressed with a pharmacist or physician: None reported  Questions for the pharmacist: No    Confirmed patient received a Conservation officer, historic buildings and a Surveyor, mining with first shipment. The patient will receive a drug information handout for each medication shipped and additional FDA Medication Guides as required.       DISEASE/MEDICATION-SPECIFIC INFORMATION        N/A    SPECIALTY MEDICATION ADHERENCE     Medication Adherence    Patient reported X missed doses in the last month: 0  Specialty Medication: tacrolimus  1 mg/mL oral suspension (CAPS)  Patient is on additional specialty medications: Yes  Additional Specialty Medications: mycophenolate : MYHIBBIN  200 mg/mL suspension  Patient Reported Additional Medication X Missed Doses in the Last Month: 0  Patient is on more than two specialty medications: No  Any gaps in refill history greater than 2 weeks in the last 3 months: no  Demonstrates understanding of importance of adherence: yes              Were doses missed due to medication being on hold? No      tacrolimus  1 mg/mL oral suspension (CAPS):  8  days of medicine on hand        REFERRAL TO PHARMACIST     Referral to the pharmacist: Not needed      [facility]     Shipping address confirmed in Epic.     Cost and Payment: Patient has a $0 copay, payment information is not required.  Delivery Scheduled: Yes, Expected medication delivery date: 01/24/24.     Medication will be delivered via Same Day Courier to the prescription address in Epic WAM.    Tawni Daring   [facility] Specialty and Home Delivery Pharmacy  Specialty Technician

## 2024-01-18 LAB — VITAMIN D 25 HYDROXY: VITAMIN D, TOTAL (25OH): 28.4 ng/mL (ref 20.0–80.0)

## 2024-01-18 NOTE — Unmapped (Addendum)
 TFC left a message for patient to call back to discuss options for insurance.  Pending call back.  01/18/24 12:22pm    Patient called back.  She is going to try and reapply for Mediciad under herself.  If that does not work she will apply for a Ecologist.  I asked her to call me back if she has any problems. Next appt is Nov. 24th.

## 2024-01-23 LAB — FSAB CLASS 1 ANTIBODY SPECIFICITY: HLA CLASS 1 ANTIBODY RESULT: NEGATIVE

## 2024-01-24 LAB — HLA DS POST TRANSPLANT
ANTI-DONOR DRW #1 MFI: 0 MFI
ANTI-DONOR HLA-A #1 MFI: 72 MFI
ANTI-DONOR HLA-A #2 MFI: 0 MFI
ANTI-DONOR HLA-B #1 MFI: 0 MFI
ANTI-DONOR HLA-B #2 MFI: 0 MFI
ANTI-DONOR HLA-C #1 MFI: 0 MFI
ANTI-DONOR HLA-DQB #1 MFI: 0 MFI
ANTI-DONOR HLA-DQB #2 MFI: 0 MFI
ANTI-DONOR HLA-DR #1 MFI: 0 MFI
ANTI-DONOR HLA-DR #2 MFI: 51 MFI

## 2024-01-24 LAB — FSAB CLASS 2 ANTIBODY SPECIFICITY
CLASS 2 ANTIBODIES IDENTIFIED: 1:1 {titer}
HLA CL2 AB RESULT: POSITIVE

## 2024-01-24 MED FILL — ORA-BLEND ORAL SUSPENSION, TACROLIMUS 5 MG CAPSULE, IMMEDIATE-RELEASE: ORAL | 30 days supply | Qty: 48 | Fill #1

## 2024-01-24 MED FILL — MYHIBBIN 200 MG/ML ORAL SUSPENSION: ORAL | 34 days supply | Qty: 175 | Fill #5

## 2024-01-26 DIAGNOSIS — Z940 Kidney transplant status: Principal | ICD-10-CM

## 2024-01-26 DIAGNOSIS — E611 Iron deficiency: Principal | ICD-10-CM

## 2024-02-02 LAB — CBC W/ DIFFERENTIAL
BANDED NEUTROPHILS ABSOLUTE COUNT: 0 x10E3/uL (ref 0.0–0.1)
BASOPHILS ABSOLUTE COUNT: 0 x10E3/uL (ref 0.0–0.2)
BASOPHILS RELATIVE PERCENT: 1 %
EOSINOPHILS ABSOLUTE COUNT: 0.1 x10E3/uL (ref 0.0–0.4)
EOSINOPHILS RELATIVE PERCENT: 3 %
HEMATOCRIT: 33.1 % — ABNORMAL LOW (ref 34.0–46.6)
HEMOGLOBIN: 10 g/dL — ABNORMAL LOW (ref 11.1–15.9)
IMMATURE GRANULOCYTES: 0 %
LYMPHOCYTES ABSOLUTE COUNT: 0.8 x10E3/uL (ref 0.7–3.1)
LYMPHOCYTES RELATIVE PERCENT: 20 %
MEAN CORPUSCULAR HEMOGLOBIN CONC: 30.2 g/dL — ABNORMAL LOW (ref 31.5–35.7)
MEAN CORPUSCULAR HEMOGLOBIN: 23.9 pg — ABNORMAL LOW (ref 26.6–33.0)
MEAN CORPUSCULAR VOLUME: 79 fL (ref 79–97)
MONOCYTES ABSOLUTE COUNT: 0.3 x10E3/uL (ref 0.1–0.9)
MONOCYTES RELATIVE PERCENT: 7 %
NEUTROPHILS ABSOLUTE COUNT: 2.8 x10E3/uL (ref 1.4–7.0)
NEUTROPHILS RELATIVE PERCENT: 69 %
PLATELET COUNT: 327 x10E3/uL (ref 150–450)
RED BLOOD CELL COUNT: 4.18 x10E6/uL (ref 3.77–5.28)
RED CELL DISTRIBUTION WIDTH: 14.2 % (ref 11.7–15.4)
WHITE BLOOD CELL COUNT: 4.1 x10E3/uL (ref 3.4–10.8)

## 2024-02-02 LAB — RENAL FUNCTION PANEL
ALBUMIN: 4.7 g/dL (ref 4.0–5.0)
BLOOD UREA NITROGEN: 19 mg/dL (ref 6–20)
BUN / CREAT RATIO: 16 (ref 9–23)
CALCIUM: 9.8 mg/dL (ref 8.7–10.2)
CHLORIDE: 103 mmol/L (ref 96–106)
CO2: 19 mmol/L — ABNORMAL LOW (ref 20–29)
CREATININE: 1.22 mg/dL — ABNORMAL HIGH (ref 0.57–1.00)
EGFR: 65 mL/min/1.73
GLUCOSE: 95 mg/dL (ref 70–99)
PHOSPHORUS, SERUM: 3.9 mg/dL (ref 3.0–4.3)
POTASSIUM: 3.9 mmol/L (ref 3.5–5.2)
SODIUM: 139 mmol/L (ref 134–144)

## 2024-02-03 LAB — TACROLIMUS LEVEL: TACROLIMUS BLOOD: 3.2 ng/mL — ABNORMAL LOW (ref 5.0–20.0)

## 2024-02-05 NOTE — Unmapped (Signed)
 Transplant Lab Follow-Up    Patient was called to discuss laboratory results. Tacrolimus  level was low at 3.2 ng/mL (goal 5-7 ng/mL). Plan to continue current tacrolimus  (Prograf ) dose of 0.8 mg every 12 hours. Patient will get repeat labs on Wednesday, September 10th. Plan to follow-up with other labwork as results made available. All questions answered. Patient verbalized understanding & agreed with the plan.     Laboratory Results Summary     Immunosuppression  Administration Times: 0800/2000  Current Regimen: Tacrolimus  0.8 mg every 12 hours  NEW Regimen: Tacrolimus  0.8 mg every 12 hours  Trough Goal: 5-7 ng/mL    Lab Results   Component Value Date    TACROLIMUS  3.2 (L) 02/01/2024    TACROLIMUS  6.1 01/15/2024    TACROLIMUS  7.2 01/08/2024     Infectious Disease/Viral Testing    Lab Results   Component Value Date    CMV Viral Ld Not Detected 01/15/2024     Lab Results   Component Value Date    CMV Quant Negative 06/06/2023     Other Laboratory Results  Lab Results   Component Value Date    BUN 19 02/01/2024    CREATININE 1.22 (H) 02/01/2024    K 3.9 02/01/2024    GLU 96 01/15/2024    MG 1.7 (L) 01/15/2024     Lab Results   Component Value Date    WBC 4.1 02/01/2024    HGB 10.0 (L) 02/01/2024    HCT 33.1 (L) 02/01/2024    PLT 327 02/01/2024    NEUTROABS 2.8 02/01/2024       Milly FABIENE Moder, PharmD, BCPPS, CPP  Clinical Pharmacist Practitioner - Pediatric Solid Organ Transplant

## 2024-02-10 LAB — CBC W/ DIFFERENTIAL
BANDED NEUTROPHILS ABSOLUTE COUNT: 0 x10E3/uL (ref 0.0–0.1)
BASOPHILS ABSOLUTE COUNT: 0 x10E3/uL (ref 0.0–0.2)
BASOPHILS RELATIVE PERCENT: 1 %
EOSINOPHILS ABSOLUTE COUNT: 0.1 x10E3/uL (ref 0.0–0.4)
EOSINOPHILS RELATIVE PERCENT: 1 %
HEMATOCRIT: 31.3 % — ABNORMAL LOW (ref 34.0–46.6)
HEMOGLOBIN: 9.7 g/dL — ABNORMAL LOW (ref 11.1–15.9)
IMMATURE GRANULOCYTES: 0 %
LYMPHOCYTES ABSOLUTE COUNT: 0.9 x10E3/uL (ref 0.7–3.1)
LYMPHOCYTES RELATIVE PERCENT: 16 %
MEAN CORPUSCULAR HEMOGLOBIN CONC: 31 g/dL — ABNORMAL LOW (ref 31.5–35.7)
MEAN CORPUSCULAR HEMOGLOBIN: 24 pg — ABNORMAL LOW (ref 26.6–33.0)
MEAN CORPUSCULAR VOLUME: 77 fL — ABNORMAL LOW (ref 79–97)
MONOCYTES ABSOLUTE COUNT: 0.4 x10E3/uL (ref 0.1–0.9)
MONOCYTES RELATIVE PERCENT: 7 %
NEUTROPHILS ABSOLUTE COUNT: 4.2 x10E3/uL (ref 1.4–7.0)
NEUTROPHILS RELATIVE PERCENT: 75 %
PLATELET COUNT: 295 x10E3/uL (ref 150–450)
RED BLOOD CELL COUNT: 4.05 x10E6/uL (ref 3.77–5.28)
RED CELL DISTRIBUTION WIDTH: 14.3 % (ref 11.7–15.4)
WHITE BLOOD CELL COUNT: 5.6 x10E3/uL (ref 3.4–10.8)

## 2024-02-10 LAB — RENAL FUNCTION PANEL
ALBUMIN: 4.7 g/dL (ref 4.0–5.0)
BLOOD UREA NITROGEN: 14 mg/dL (ref 6–20)
BUN / CREAT RATIO: 12 (ref 9–23)
CALCIUM: 9.8 mg/dL (ref 8.7–10.2)
CHLORIDE: 103 mmol/L (ref 96–106)
CO2: 22 mmol/L (ref 20–29)
CREATININE: 1.17 mg/dL — ABNORMAL HIGH (ref 0.57–1.00)
EGFR: 69 mL/min/1.73
GLUCOSE: 91 mg/dL (ref 70–99)
PHOSPHORUS, SERUM: 3.9 mg/dL (ref 3.0–4.3)
POTASSIUM: 3.8 mmol/L (ref 3.5–5.2)
SODIUM: 140 mmol/L (ref 134–144)

## 2024-02-11 LAB — TACROLIMUS LEVEL: TACROLIMUS BLOOD: 3.3 ng/mL — ABNORMAL LOW (ref 5.0–20.0)

## 2024-02-12 NOTE — Unmapped (Signed)
 Transplant Lab Follow-Up    Patient was called to discuss laboratory results. Tacrolimus  level was low at 3.2 ng/mL (goal 5-7 ng/mL). Plan to increase tacrolimus  (Prograf ) dose to 1 mg every 12 hours. Patient will get repeat labs in 1 week. Plan to follow-up with other labwork as results made available. All questions answered. Patient verbalized understanding & agreed with the plan.     Laboratory Results Summary     Immunosuppression  Administration Times: 0800/2000  Current Regimen: Tacrolimus  0.8 mg every 12 hours  NEW Regimen: Tacrolimus  1 mg every 12 hours  Trough Goal: 5-7 ng/mL    Lab Results   Component Value Date    TACROLIMUS  3.3 (L) 02/09/2024    TACROLIMUS  3.2 (L) 02/01/2024    TACROLIMUS  6.1 01/15/2024     Infectious Disease/Viral Testing    Lab Results   Component Value Date    CMV Viral Ld Not Detected 01/15/2024     Lab Results   Component Value Date    CMV Quant Negative 06/06/2023     Other Laboratory Results  Lab Results   Component Value Date    BUN 14 02/09/2024    CREATININE 1.17 (H) 02/09/2024    K 3.8 02/09/2024    GLU 96 01/15/2024    MG 1.7 (L) 01/15/2024     Lab Results   Component Value Date    WBC 5.6 02/09/2024    HGB 9.7 (L) 02/09/2024    HCT 31.3 (L) 02/09/2024    PLT 295 02/09/2024    NEUTROABS 4.2 02/09/2024       Milly FABIENE Moder, PharmD, BCPPS, CPP  Clinical Pharmacist Practitioner - Pediatric Solid Organ Transplant

## 2024-02-13 MED ORDER — TACROLIMUS ORAL SUS 1MG/ML (CAPS)
Freq: Two times a day (BID) | ORAL | 11 refills | 30.00000 days | Status: CP
Start: 2024-02-13 — End: ?
  Filled 2024-02-28: qty 60, 30d supply, fill #0

## 2024-02-13 NOTE — Unmapped (Addendum)
 [facility] Pharmacist has reviewed a new prescription for tacrolimus  that indicates a dose increase.  Patient was counseled on this dosage change by JP- see epic note from 9/15.  Next refill call date adjusted if necessary.      Clinical Assessment Needed For: Dose Change  Medication: TACROLIMUS  1 MG/ML  Last Fill Date/Day Supply: 01/24/24 / 30  Copay $0.00/30  Was previous dose already scheduled to fill: No    Notes to Pharmacist: N/A

## 2024-02-16 NOTE — Unmapped (Signed)
 Michelle [name] has been contacted in regards to their refill of tacrolimus  suspension. At this time, they have declined refill due to pt reports she has more than half the bottle remaining.[name] Refill assessment call date has been updated per the patient's request. I reviewed the increased dose with her and reminded her that the tacrolimus  is compounded and often takes extra time to dispense.     Refill call has been pushed out 1 week.    Muneeb Veras, PharmD

## 2024-02-21 NOTE — Unmapped (Signed)
 [facility] Specialty and Home Delivery Pharmacy Refill Coordination Note    Specialty Medication(s) to be Shipped:   Transplant: Myhibbin  and tacrolimus  oral suspension 1mg /ml    Other medication(s) to be shipped: No additional medications requested for fill at this time    Specialty Medications not needed at this time: N/A     Michelle [name], DOB: 07-14-2005  Phone: [phone] (home)       All above HIPAA information was verified with patient.     Was a Nurse, learning disability used for this call? No    Completed refill call assessment today to schedule patient's medication shipment from the [facility] and Home Delivery Pharmacy  [phone]).  All relevant notes have been reviewed.     Specialty medication(s) and dose(s) confirmed: Regimen is correct and unchanged.   Changes to medications: Verniece reports no changes at this time.  Changes to insurance: No  New side effects reported not previously addressed with a pharmacist or physician: None reported  Questions for the pharmacist: No    Confirmed patient received a Conservation officer, historic buildings and a Surveyor, mining with first shipment. The patient will receive a drug information handout for each medication shipped and additional FDA Medication Guides as required.       DISEASE/MEDICATION-SPECIFIC INFORMATION        N/A    SPECIALTY MEDICATION ADHERENCE     Medication Adherence    Patient reported X missed doses in the last month: 0  Specialty Medication: tacrolimus  1 mg/mL oral suspension (CAPS)  Patient is on additional specialty medications: Yes  Additional Specialty Medications: MYHIBBIN  200 mg/mL suspension (mycophenolate )  Patient Reported Additional Medication X Missed Doses in the Last Month: 0  Patient is on more than two specialty medications: No              Were doses missed due to medication being on hold? No      MYHIBBIN  200 mg/mL suspension (mycophenolate ): 7 days of medicine on hand     tacrolimus  1 mg/mL oral suspension (CAPS): 7 days of medicine on hand REFERRAL TO PHARMACIST     Referral to the pharmacist: Not needed      [facility]     Shipping address confirmed in Epic.     Cost and Payment: Patient has a $0 copay, payment information is not required.    Delivery Scheduled: Yes, Expected medication delivery date: 02/28/24.     Medication will be delivered via Same Day Courier to the prescription address in Epic WAM.    [facility] Special[facility] and Home Delivery Pharmacy  Specialty Technician

## 2024-02-26 ENCOUNTER — Ambulatory Visit: Admit: 2024-02-26 | Discharge: 2024-02-27 | Payer: Medicaid (Managed Care)

## 2024-02-26 DIAGNOSIS — Z940 Kidney transplant status: Principal | ICD-10-CM

## 2024-02-26 DIAGNOSIS — E611 Iron deficiency: Principal | ICD-10-CM

## 2024-02-26 LAB — URINALYSIS WITH MICROSCOPY WITH CULTURE REFLEX PERFORMABLE
BILIRUBIN UA: NEGATIVE
GLUCOSE UA: NEGATIVE
KETONES UA: NEGATIVE
NITRITE UA: NEGATIVE
PH UA: 6.5 (ref 5.0–9.0)
PROTEIN UA: NEGATIVE
RBC UA: 1 /HPF (ref <=4)
SPECIFIC GRAVITY UA: 1.009 (ref 1.003–1.030)
SQUAMOUS EPITHELIAL: 1 /HPF (ref 0–5)
UROBILINOGEN UA: <2
WBC UA: 10 /HPF — ABNORMAL HIGH (ref 0–5)

## 2024-02-26 LAB — PROTEIN / CREATININE RATIO, URINE
CREATININE, URINE: 80.8 mg/dL
PROTEIN URINE: 18.2 mg/dL
PROTEIN/CREAT RATIO, URINE: 0.225

## 2024-02-26 LAB — IRON PANEL
IRON SATURATION: 8 % — ABNORMAL LOW (ref 20–55)
IRON: 35 ug/dL — ABNORMAL LOW (ref 50–170)
TOTAL IRON BINDING CAPACITY: 422 ug/dL (ref 250–425)

## 2024-02-26 NOTE — Unmapped (Signed)
 Contacted pt, requested she go for labs and urine today to a [facility] lab. Pt will head to [place].  Pt has c/o ankle swelling for the past week. Pt does not have working BP monitor at home so unsure how her BP is. Pt denies fever or any UTI symptoms.  Primary neph aware.

## 2024-02-28 MED FILL — MYHIBBIN 200 MG/ML ORAL SUSPENSION: ORAL | 34 days supply | Qty: 175 | Fill #6

## 2024-03-01 DIAGNOSIS — D509 Iron deficiency anemia, unspecified: Principal | ICD-10-CM

## 2024-03-01 NOTE — Unmapped (Signed)
 Therapy plan entered for ferric gluconate.    Cheo Selvey R. Evellyn Tuff, PharmD, BCPPS, CPP  Clinical Pharmacist Practitioner - Pediatric Solid Organ Transplant

## 2024-03-05 DIAGNOSIS — D509 Iron deficiency anemia, unspecified: Principal | ICD-10-CM

## 2024-03-05 NOTE — Unmapped (Signed)
 Addended by: Haroon Shatto R on: 03/05/2024 11:52 AM     Modules accepted: Orders

## 2024-03-05 NOTE — Unmapped (Signed)
 Addended by: Macarius Ruark R on: 03/05/2024 11:51 AM     Modules accepted: Orders

## 2024-03-06 NOTE — Unmapped (Signed)
 Request from Dr. Roel to schedule 5 iron  infusions, 2 weeks apart. Orders are authorized. Called Mckay and reached voicemail; LVM with infusion scheduling line [phone]).

## 2024-03-06 NOTE — Unmapped (Signed)
 Spoke with pt today regarding the plan for an iron  infusion which has been ordered.  I went over with pt her low iron  and Hgb levels. We discussed this as reason for the need for the iron  infusion. Pt aware of potential side effects of an iron  infusion, where infusion will take place, how long infusion should take and how many sessions the doctor has ordered.  Pt was able to have all her questions answered and agrees with plan to move forward with iron  infusion.

## 2024-03-06 NOTE — Unmapped (Signed)
 Received call back from Rayshell, who was unaware of plan for iron  infusions; she is requesting to speak with nephrology team before scheduling. Notified Dr. Roel and team and will call patient back after that conversation if pt is on board.

## 2024-03-07 NOTE — Unmapped (Signed)
 Called and spoke with Michelle [name] to schedule 5 iron  infusions. Infusion process explained and no questions at this time. All 5 infusions scheduled as follows:    03/14/24 @ 11:30 am   03/28/24 @ 1 pm   04/11/24 @ 9 am   04/29/24 @ 10:30 am   05/13/24 @ 10 am

## 2024-03-14 ENCOUNTER — Encounter: Admit: 2024-03-14 | Discharge: 2024-03-15 | Payer: Medicaid (Managed Care)

## 2024-03-14 DIAGNOSIS — D509 Iron deficiency anemia, unspecified: Principal | ICD-10-CM

## 2024-03-14 LAB — CBC W/ AUTO DIFF
BASOPHILS ABSOLUTE COUNT: 0 10*9/L (ref 0.0–0.1)
BASOPHILS RELATIVE PERCENT: 0.8 %
EOSINOPHILS ABSOLUTE COUNT: 0.1 10*9/L (ref 0.0–0.5)
EOSINOPHILS RELATIVE PERCENT: 1.5 %
HEMATOCRIT: 30.4 % — ABNORMAL LOW (ref 34.0–44.0)
HEMOGLOBIN: 10 g/dL — ABNORMAL LOW (ref 11.3–14.9)
LYMPHOCYTES ABSOLUTE COUNT: 0.8 10*9/L — ABNORMAL LOW (ref 1.1–3.6)
LYMPHOCYTES RELATIVE PERCENT: 19.1 %
MEAN CORPUSCULAR HEMOGLOBIN CONC: 32.8 g/dL (ref 32.3–35.0)
MEAN CORPUSCULAR HEMOGLOBIN: 23.9 pg — ABNORMAL LOW (ref 25.9–32.4)
MEAN CORPUSCULAR VOLUME: 72.8 fL — ABNORMAL LOW (ref 77.6–95.7)
MEAN PLATELET VOLUME: 8.8 fL (ref 7.3–10.7)
MONOCYTES ABSOLUTE COUNT: 0.3 10*9/L (ref 0.3–0.8)
MONOCYTES RELATIVE PERCENT: 5.8 %
NEUTROPHILS ABSOLUTE COUNT: 3.2 10*9/L (ref 1.5–6.4)
NEUTROPHILS RELATIVE PERCENT: 72.8 %
PLATELET COUNT: 273 10*9/L (ref 170–380)
RED BLOOD CELL COUNT: 4.18 10*12/L (ref 3.95–5.13)
RED CELL DISTRIBUTION WIDTH: 16 % — ABNORMAL HIGH (ref 12.2–15.2)
WBC ADJUSTED: 4.4 10*9/L (ref 4.2–10.2)

## 2024-03-14 LAB — RENAL FUNCTION PANEL
ALBUMIN: 4 g/dL (ref 3.4–5.0)
ANION GAP: 10 mmol/L (ref 7–15)
BLOOD UREA NITROGEN: 16 mg/dL (ref 7–18)
BUN / CREAT RATIO: 16
CALCIUM: 9.5 mg/dL (ref 8.5–10.1)
CHLORIDE: 104 mmol/L (ref 97–107)
CO2: 24.9 mmol/L (ref 21.0–32.0)
CREATININE: 1.02 mg/dL — ABNORMAL HIGH (ref 0.51–0.95)
EGFR CKD-EPI (2021) FEMALE: 81 mL/min/1.73m2 (ref >=60)
GLUCOSE RANDOM: 120 mg/dL (ref 70–179)
PHOSPHORUS: 3.3 mg/dL (ref 2.6–4.7)
POTASSIUM: 3.8 mmol/L (ref 3.3–4.7)
SODIUM: 139 mmol/L (ref 135–145)

## 2024-03-14 LAB — IRON PANEL
IRON SATURATION: 7 % — ABNORMAL LOW (ref 20–55)
IRON: 33 ug/dL — ABNORMAL LOW (ref 50–170)
TOTAL IRON BINDING CAPACITY: 459 ug/dL — ABNORMAL HIGH (ref 250–450)

## 2024-03-14 LAB — SLIDE REVIEW

## 2024-03-14 LAB — RETICULOCYTES
RETICULOCYTE ABSOLUTE COUNT: 52.4 10*9/L (ref 27.0–90.0)
RETICULOCYTE COUNT PCT: 1.25 % (ref 0.56–1.85)

## 2024-03-14 MED ADMIN — acetaminophen (TYLENOL) oral liquid: 650 mg | ORAL | @ 16:00:00 | Stop: 2024-03-14

## 2024-03-14 MED ADMIN — sodium ferric gluconate (FERRLECIT) 110.5 mg in sodium chloride (NS) 0.9 % 100 mL IVPB: 1.5 mg/kg | INTRAVENOUS | @ 16:00:00 | Stop: 2024-03-14

## 2024-03-14 MED ADMIN — diphenhydrAMINE (BENADRYL) oral elixir: 25 mg | ORAL | @ 16:00:00 | Stop: 2024-03-14

## 2024-03-14 NOTE — Unmapped (Signed)
 Yecenia in to infusion room with mom for #1/5 ferrlecit. VSS, patient is well-appearing.   PO tylenol and benadryl given.   PIV inserted to right [facility] x1 attempt by this RN without difficulty. Labs collected from venous catheter.   Iron  infused over one hour per orders and 60 minute observation completed without complications. VS remained stable.   PIV removed and patient discharged ambulatory with mom. Next infusions every 2 weeks as follows: 10/30 at 1 pm, 11/13 at 9 am, 12/1 at 10:30 am, and 12/15 at 10 am.

## 2024-03-19 NOTE — Unmapped (Signed)
 [facility] Specialty and Home Delivery Pharmacy Refill Coordination Note    Specialty Medication(s) to be Shipped:   Transplant: Myhibbin  and tacrolimus  oral suspension 1mg /ml    Other medication(s) to be shipped: No additional medications requested for fill at this time    Specialty Medications not needed at this time: N/A     Michelle [name], DOB: 2005/11/23  Phone: [phone] (home)       All above HIPAA information was verified with patient.     Was a Nurse, learning disability used for this call? No    Completed refill call assessment today to schedule patient's medication shipment from the [facility] and Home Delivery Pharmacy  [phone]).  All relevant notes have been reviewed.     Specialty medication(s) and dose(s) confirmed: Regimen is correct and unchanged.   Changes to medications: Ezmae reports no changes at this time.  Changes to insurance: No  New side effects reported not previously addressed with a pharmacist or physician: None reported  Questions for the pharmacist: No    Confirmed patient received a Conservation officer, historic buildings and a Surveyor, mining with first shipment. The patient will receive a drug information handout for each medication shipped and additional FDA Medication Guides as required.       DISEASE/MEDICATION-SPECIFIC INFORMATION        N/A    SPECIALTY MEDICATION ADHERENCE     Medication Adherence    Patient reported X missed doses in the last month: 0  Specialty Medication: MYHIBBIN  200 mg/mL suspension (mycophenolate )  Patient is on additional specialty medications: Yes  Additional Specialty Medications: tacrolimus  1 mg/mL oral suspension (CAPS)  Patient Reported Additional Medication X Missed Doses in the Last Month: 0  Any gaps in refill history greater than 2 weeks in the last 3 months: no              Were doses missed due to medication being on hold? No      MYHIBBIN  200 mg/mL suspension (mycophenolate ): 7 days of medicine on hand     tacrolimus  1 mg/mL oral suspension (CAPS): 7 days of medicine on hand     REFERRAL TO PHARMACIST     Referral to the pharmacist: Not needed      [facility]     Shipping address confirmed in Epic.     Cost and Payment: Patient has a $0 copay, payment information is not required.    Delivery Scheduled: Yes, Expected medication delivery date: 03/26/24.     Medication will be delivered via Same Day Courier to the prescription address in Epic WAM.    [facility] Specialty and Home Delivery Pharmacy  Specialty Technician

## 2024-03-26 MED FILL — ORA-BLEND ORAL SUSPENSION, TACROLIMUS 5 MG CAPSULE, IMMEDIATE-RELEASE: ORAL | 30 days supply | Qty: 60 | Fill #1

## 2024-03-26 MED FILL — MYHIBBIN 200 MG/ML ORAL SUSPENSION: ORAL | 34 days supply | Qty: 175 | Fill #7

## 2024-03-28 ENCOUNTER — Encounter: Admit: 2024-03-28 | Discharge: 2024-03-29 | Payer: Medicaid (Managed Care)

## 2024-03-28 DIAGNOSIS — D509 Iron deficiency anemia, unspecified: Principal | ICD-10-CM

## 2024-03-28 LAB — SLIDE REVIEW

## 2024-03-28 LAB — RETICULOCYTES
RETICULOCYTE ABSOLUTE COUNT: 46.1 10*9/L (ref 27.0–90.0)
RETICULOCYTE COUNT PCT: 1.07 % (ref 0.56–1.85)

## 2024-03-28 LAB — RENAL FUNCTION PANEL
ALBUMIN: 4.1 g/dL (ref 3.4–5.0)
ANION GAP: 11 mmol/L (ref 7–15)
BLOOD UREA NITROGEN: 16 mg/dL (ref 7–18)
BUN / CREAT RATIO: 14
CALCIUM: 9.7 mg/dL (ref 8.5–10.1)
CHLORIDE: 105 mmol/L (ref 97–107)
CO2: 24.1 mmol/L (ref 21.0–32.0)
CREATININE: 1.17 mg/dL — ABNORMAL HIGH (ref 0.51–0.95)
EGFR CKD-EPI (2021) FEMALE: 69 mL/min/1.73m2 (ref >=60)
GLUCOSE RANDOM: 116 mg/dL (ref 70–179)
PHOSPHORUS: 2.9 mg/dL (ref 2.6–4.7)
POTASSIUM: 4 mmol/L (ref 3.3–4.7)
SODIUM: 140 mmol/L (ref 135–145)

## 2024-03-28 LAB — CBC W/ AUTO DIFF
BASOPHILS ABSOLUTE COUNT: 0 10*9/L (ref 0.0–0.1)
BASOPHILS RELATIVE PERCENT: 0.8 %
EOSINOPHILS ABSOLUTE COUNT: 0.1 10*9/L (ref 0.0–0.5)
EOSINOPHILS RELATIVE PERCENT: 1.3 %
HEMATOCRIT: 31.9 % — ABNORMAL LOW (ref 34.0–44.0)
HEMOGLOBIN: 10.3 g/dL — ABNORMAL LOW (ref 11.3–14.9)
LYMPHOCYTES ABSOLUTE COUNT: 0.8 10*9/L — ABNORMAL LOW (ref 1.1–3.6)
LYMPHOCYTES RELATIVE PERCENT: 17.1 %
MEAN CORPUSCULAR HEMOGLOBIN CONC: 32.4 g/dL (ref 32.3–35.0)
MEAN CORPUSCULAR HEMOGLOBIN: 24.1 pg — ABNORMAL LOW (ref 25.9–32.4)
MEAN CORPUSCULAR VOLUME: 74.4 fL — ABNORMAL LOW (ref 77.6–95.7)
MEAN PLATELET VOLUME: 10 fL (ref 7.3–10.7)
MONOCYTES ABSOLUTE COUNT: 0.2 10*9/L — ABNORMAL LOW (ref 0.3–0.8)
MONOCYTES RELATIVE PERCENT: 5.1 %
NEUTROPHILS ABSOLUTE COUNT: 3.4 10*9/L (ref 1.5–6.4)
NEUTROPHILS RELATIVE PERCENT: 75.7 %
PLATELET COUNT: 243 10*9/L (ref 170–380)
RED BLOOD CELL COUNT: 4.29 10*12/L (ref 3.95–5.13)
RED CELL DISTRIBUTION WIDTH: 17 % — ABNORMAL HIGH (ref 12.2–15.2)
WBC ADJUSTED: 4.5 10*9/L (ref 4.2–10.2)

## 2024-03-28 LAB — IRON PANEL
IRON SATURATION: 18 % — ABNORMAL LOW (ref 20–55)
IRON: 71 ug/dL (ref 50–170)
TOTAL IRON BINDING CAPACITY: 403 ug/dL (ref 250–450)

## 2024-03-28 MED ADMIN — diphenhydrAMINE (BENADRYL) oral elixir: 25 mg | ORAL | @ 17:00:00 | Stop: 2024-03-28

## 2024-03-28 MED ADMIN — acetaminophen (TYLENOL) oral liquid: 650 mg | ORAL | @ 17:00:00 | Stop: 2024-03-28

## 2024-03-28 MED ADMIN — sodium ferric gluconate (FERRLECIT) 110.5 mg in sodium chloride (NS) 0.9 % 100 mL IVPB: 1.5 mg/kg | INTRAVENOUS | @ 18:00:00 | Stop: 2024-03-28

## 2024-03-28 NOTE — Progress Notes (Signed)
 Nerea arrived to the [facility] infusion room for #2/5 ferrlecit. VSS, pt well appearing with no co of pain.   PO Tylenol and PO Benadryl given as pre-medications. PIV inserted in L AC after 1 failed attempt by this RN. Labs collected via PIV and sent. Iron  infused per orders, 1 hr, VSS throughout. 60 minute observation completed, VSS throughout.     PIV removed and pt. Discharged home.     Next infusion (q2 wks):   #3/5 04/11/24 @ 0900  #4/5 04/29/24 @ 1030  #5/5 05/13/24 @ 1000

## 2024-04-03 DIAGNOSIS — Z940 Kidney transplant status: Principal | ICD-10-CM

## 2024-04-04 LAB — CBC W/ DIFFERENTIAL
BANDED NEUTROPHILS ABSOLUTE COUNT: 0 x10E3/uL (ref 0.0–0.1)
BASOPHILS ABSOLUTE COUNT: 0 x10E3/uL (ref 0.0–0.2)
BASOPHILS RELATIVE PERCENT: 1 %
EOSINOPHILS ABSOLUTE COUNT: 0.1 x10E3/uL (ref 0.0–0.4)
EOSINOPHILS RELATIVE PERCENT: 2 %
HEMATOCRIT: 33.7 % — ABNORMAL LOW (ref 34.0–46.6)
HEMOGLOBIN: 10.4 g/dL — ABNORMAL LOW (ref 11.1–15.9)
IMMATURE GRANULOCYTES: 0 %
LYMPHOCYTES ABSOLUTE COUNT: 1.4 x10E3/uL (ref 0.7–3.1)
LYMPHOCYTES RELATIVE PERCENT: 29 %
MEAN CORPUSCULAR HEMOGLOBIN CONC: 30.9 g/dL — ABNORMAL LOW (ref 31.5–35.7)
MEAN CORPUSCULAR HEMOGLOBIN: 24.2 pg — ABNORMAL LOW (ref 26.6–33.0)
MEAN CORPUSCULAR VOLUME: 79 fL (ref 79–97)
MONOCYTES ABSOLUTE COUNT: 0.3 x10E3/uL (ref 0.1–0.9)
MONOCYTES RELATIVE PERCENT: 6 %
NEUTROPHILS ABSOLUTE COUNT: 2.9 x10E3/uL (ref 1.4–7.0)
NEUTROPHILS RELATIVE PERCENT: 62 %
PLATELET COUNT: 303 x10E3/uL (ref 150–450)
RED BLOOD CELL COUNT: 4.29 x10E6/uL (ref 3.77–5.28)
RED CELL DISTRIBUTION WIDTH: 15.8 % — ABNORMAL HIGH (ref 11.7–15.4)
WHITE BLOOD CELL COUNT: 4.7 x10E3/uL (ref 3.4–10.8)

## 2024-04-04 LAB — RENAL FUNCTION PANEL
ALBUMIN: 4.3 g/dL (ref 4.0–5.0)
BLOOD UREA NITROGEN: 15 mg/dL (ref 6–20)
BUN / CREAT RATIO: 13 (ref 9–23)
CALCIUM: 9.5 mg/dL (ref 8.7–10.2)
CHLORIDE: 105 mmol/L (ref 96–106)
CO2: 20 mmol/L (ref 20–29)
CREATININE: 1.14 mg/dL — ABNORMAL HIGH (ref 0.57–1.00)
EGFR: 71 mL/min/1.73
GLUCOSE: 96 mg/dL (ref 70–99)
PHOSPHORUS, SERUM: 3.9 mg/dL (ref 3.0–4.3)
POTASSIUM: 4.1 mmol/L (ref 3.5–5.2)
SODIUM: 141 mmol/L (ref 134–144)

## 2024-04-05 LAB — TACROLIMUS LEVEL: TACROLIMUS BLOOD: 10.1 ng/mL (ref 5.0–20.0)

## 2024-04-05 NOTE — Telephone Encounter (Signed)
 Transplant Lab Follow-Up    Patient was called to discuss laboratory results. Tacrolimus  level was elevated at 10.1 ng/mL (goal 5-7 ng/mL). Plan to decrease tacrolimus  (Prograf ) dose to 0.8 mg every 12 hours. Patient will get repeat labs in 2 weeks. Plan to follow-up with other labwork as results made available. All questions answered. Patient verbalized understanding & agreed with the plan.     Laboratory Results Summary     Immunosuppression  Administration Times: 0800/2000  Current Regimen: Tacrolimus  1 mg every 12 hours  NEW Regimen: Tacrolimus  0.8 mg every 12 hours  Trough Goal: 5-7 ng/mL    Lab Results   Component Value Date    TACROLIMUS  10.1 04/03/2024    TACROLIMUS  3.3 (L) 02/09/2024    TACROLIMUS  3.2 (L) 02/01/2024     Infectious Disease/Viral Testing    Lab Results   Component Value Date    CMV Viral Ld Not Detected 01/15/2024     Lab Results   Component Value Date    CMV Quant Negative 06/06/2023     Other Laboratory Results  Lab Results   Component Value Date    BUN 15 04/03/2024    CREATININE 1.14 (H) 04/03/2024    K 4.1 04/03/2024    GLU 116 03/28/2024    MG 1.7 (L) 01/15/2024     Lab Results   Component Value Date    WBC 4.7 04/03/2024    HGB 10.4 (L) 04/03/2024    HCT 33.7 (L) 04/03/2024    PLT 303 04/03/2024    NEUTROABS 2.9 04/03/2024       Milly FABIENE Moder, PharmD, BCPPS, CPP  Clinical Pharmacist Practitioner - Pediatric Solid Organ Transplant

## 2024-04-09 DIAGNOSIS — Z940 Kidney transplant status: Principal | ICD-10-CM

## 2024-04-09 MED ORDER — TACROLIMUS ORAL SUS 1MG/ML (CAPS)
Freq: Two times a day (BID) | ORAL | 11 refills | 30.00000 days | Status: CP
Start: 2024-04-09 — End: ?
  Filled 2024-05-02: qty 48, 30d supply, fill #0

## 2024-04-11 ENCOUNTER — Encounter: Admit: 2024-04-11 | Discharge: 2024-04-11 | Payer: Medicaid (Managed Care)

## 2024-04-11 DIAGNOSIS — Z940 Kidney transplant status: Principal | ICD-10-CM

## 2024-04-11 DIAGNOSIS — D509 Iron deficiency anemia, unspecified: Principal | ICD-10-CM

## 2024-04-11 LAB — CBC W/ AUTO DIFF
BASOPHILS ABSOLUTE COUNT: 0 10*9/L (ref 0.0–0.1)
BASOPHILS RELATIVE PERCENT: 0.8 %
EOSINOPHILS ABSOLUTE COUNT: 0.1 10*9/L (ref 0.0–0.5)
EOSINOPHILS RELATIVE PERCENT: 1.3 %
HEMATOCRIT: 32.1 % — ABNORMAL LOW (ref 34.0–44.0)
HEMOGLOBIN: 10.6 g/dL — ABNORMAL LOW (ref 11.3–14.9)
LYMPHOCYTES ABSOLUTE COUNT: 0.9 10*9/L — ABNORMAL LOW (ref 1.1–3.6)
LYMPHOCYTES RELATIVE PERCENT: 17.4 %
MEAN CORPUSCULAR HEMOGLOBIN CONC: 33 g/dL (ref 32.3–35.0)
MEAN CORPUSCULAR HEMOGLOBIN: 24.8 pg — ABNORMAL LOW (ref 25.9–32.4)
MEAN CORPUSCULAR VOLUME: 75.1 fL — ABNORMAL LOW (ref 77.6–95.7)
MEAN PLATELET VOLUME: 9.6 fL (ref 7.3–10.7)
MONOCYTES ABSOLUTE COUNT: 0.3 10*9/L (ref 0.3–0.8)
MONOCYTES RELATIVE PERCENT: 6.3 %
NEUTROPHILS ABSOLUTE COUNT: 3.9 10*9/L (ref 1.5–6.4)
NEUTROPHILS RELATIVE PERCENT: 74.2 %
PLATELET COUNT: 250 10*9/L (ref 170–380)
RED BLOOD CELL COUNT: 4.27 10*12/L (ref 3.95–5.13)
RED CELL DISTRIBUTION WIDTH: 17.1 % — ABNORMAL HIGH (ref 12.2–15.2)
WBC ADJUSTED: 5.2 10*9/L (ref 4.2–10.2)

## 2024-04-11 LAB — RETICULOCYTES
RETICULOCYTE ABSOLUTE COUNT: 50.4 10*9/L (ref 27.0–90.0)
RETICULOCYTE COUNT PCT: 1.18 % (ref 0.56–1.85)

## 2024-04-11 LAB — RENAL FUNCTION PANEL
ALBUMIN: 4 g/dL (ref 3.4–5.0)
ANION GAP: 10 mmol/L (ref 7–15)
BLOOD UREA NITROGEN: 15 mg/dL (ref 7–18)
BUN / CREAT RATIO: 13
CALCIUM: 9.1 mg/dL (ref 8.5–10.1)
CHLORIDE: 106 mmol/L (ref 97–107)
CO2: 26.6 mmol/L (ref 21.0–32.0)
CREATININE: 1.13 mg/dL — ABNORMAL HIGH (ref 0.51–0.95)
EGFR CKD-EPI (2021) FEMALE: 72 mL/min/1.73m2 (ref >=60)
GLUCOSE RANDOM: 87 mg/dL (ref 70–179)
PHOSPHORUS: 3.4 mg/dL (ref 2.6–4.7)
POTASSIUM: 3.7 mmol/L (ref 3.3–4.7)
SODIUM: 143 mmol/L (ref 135–145)

## 2024-04-11 LAB — IRON PANEL
IRON SATURATION: 7 % — ABNORMAL LOW (ref 20–55)
IRON: 28 ug/dL — ABNORMAL LOW (ref 50–170)
TOTAL IRON BINDING CAPACITY: 379 ug/dL (ref 250–450)

## 2024-04-11 LAB — SLIDE REVIEW

## 2024-04-11 MED ADMIN — acetaminophen (TYLENOL) oral liquid: 650 mg | ORAL | @ 14:00:00 | Stop: 2024-04-11

## 2024-04-11 MED ADMIN — diphenhydrAMINE (BENADRYL) oral elixir: 25 mg | ORAL | @ 14:00:00 | Stop: 2024-04-11

## 2024-04-11 MED ADMIN — sodium ferric gluconate (FERRLECIT) 110.5 mg in sodium chloride (NS) 0.9 % 100 mL IVPB: 1.5 mg/kg | INTRAVENOUS | @ 15:00:00 | Stop: 2024-04-11

## 2024-04-11 NOTE — Progress Notes (Signed)
 Michelle [name] arrived for #3/5 ferrlecit infusions. Patient well appearing with no complaints of pain. PIV placed in L AC by this RN without difficulty. Labs drawn via venous catheter. Patient pre-medicated with PO tylenol and benadryl. Ferrlecit infused for 1 hour, per orders, VS stable. Patient observed for 1 hour, per orders, VS stable. PIV removed prior to patient being discharged home with mom. Remaining infusions already scheduled as follows:     04/29/24 @ 10:30 am   05/13/24 @ 10 am

## 2024-04-16 LAB — ALLOSURE KIDNEY

## 2024-04-20 DIAGNOSIS — R52 Pain, unspecified: Principal | ICD-10-CM

## 2024-04-20 NOTE — Telephone Encounter (Signed)
 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  This has been an on going problem from her and she reports recently was exacerbated with exercise. She has not tried any tylenol .  No changes to her urine (no dark urine, foul smelling/cloudy, or bloody).  No radiation of pain to groin or flank. No trauma to graft site.  Recently had labs 04/11/2024 which were at baseline. Last allosure reassuring 0.2.  She had renal transplant and native US  done in March which was normal. She has not had UA since Sept 2025.  She has upcoming appt on 04/22/24 with primary. No fevers or dysuria with no significant history of recurrent UTI in graft.     Shalaunda would like to try some scheduled pain control first.  She will do Tylenol  650mg  every 4-6 hours and see where her pain is.  She will touch base with me tomorrow so that we can determine whether we need to arrange for more expedited care or whether she can have routine follow up appt Monday.  Depending on her symptoms we may still opt for her to have RUS done Monday if same day US  can be accommodated.    Discussed that should she have changes to her urine (decreased output or bloody/cloudy/dark urine), dysuria, worsening pain or fever she needs to call me back before tomorrow.  She expressed understanding and is agreeable with the plan.     Rosina Hasty, DO  [facility] Division of Pediatric Nephrology and Hypertension

## 2024-04-21 DIAGNOSIS — Z940 Kidney transplant status: Principal | ICD-10-CM

## 2024-04-22 ENCOUNTER — Ambulatory Visit: Admit: 2024-04-22 | Discharge: 2024-04-22 | Payer: Medicaid (Managed Care)

## 2024-04-22 ENCOUNTER — Ambulatory Visit
Admit: 2024-04-22 | Discharge: 2024-04-22 | Payer: Medicaid (Managed Care) | Attending: Pediatric Nephrology | Primary: Pediatric Nephrology

## 2024-04-22 ENCOUNTER — Inpatient Hospital Stay: Admit: 2024-04-22 | Discharge: 2024-04-22 | Payer: Medicaid (Managed Care)

## 2024-04-22 DIAGNOSIS — Z940 Kidney transplant status: Principal | ICD-10-CM

## 2024-04-22 DIAGNOSIS — R8281 Pyuria: Principal | ICD-10-CM

## 2024-04-22 DIAGNOSIS — N182 Chronic kidney disease, stage 2 (mild): Principal | ICD-10-CM

## 2024-04-22 DIAGNOSIS — D509 Iron deficiency anemia, unspecified: Principal | ICD-10-CM

## 2024-04-22 LAB — CBC W/ AUTO DIFF
BASOPHILS ABSOLUTE COUNT: 0 10*9/L (ref 0.0–0.1)
BASOPHILS RELATIVE PERCENT: 0.5 %
EOSINOPHILS ABSOLUTE COUNT: 0.1 10*9/L (ref 0.0–0.5)
EOSINOPHILS RELATIVE PERCENT: 1.4 %
HEMATOCRIT: 34.1 % (ref 34.0–44.0)
HEMOGLOBIN: 11.1 g/dL — ABNORMAL LOW (ref 11.3–14.9)
LYMPHOCYTES ABSOLUTE COUNT: 0.8 10*9/L — ABNORMAL LOW (ref 1.1–3.6)
LYMPHOCYTES RELATIVE PERCENT: 16.2 %
MEAN CORPUSCULAR HEMOGLOBIN CONC: 32.6 g/dL (ref 32.3–35.0)
MEAN CORPUSCULAR HEMOGLOBIN: 24.9 pg — ABNORMAL LOW (ref 25.9–32.4)
MEAN CORPUSCULAR VOLUME: 76.4 fL — ABNORMAL LOW (ref 77.6–95.7)
MEAN PLATELET VOLUME: 9.3 fL (ref 7.3–10.7)
MONOCYTES ABSOLUTE COUNT: 0.4 10*9/L (ref 0.3–0.8)
MONOCYTES RELATIVE PERCENT: 7.9 %
NEUTROPHILS ABSOLUTE COUNT: 3.8 10*9/L (ref 1.5–6.4)
NEUTROPHILS RELATIVE PERCENT: 74 %
PLATELET COUNT: 249 10*9/L (ref 170–380)
RED BLOOD CELL COUNT: 4.45 10*12/L (ref 3.95–5.13)
RED CELL DISTRIBUTION WIDTH: 18 % — ABNORMAL HIGH (ref 12.2–15.2)
WBC ADJUSTED: 5.2 10*9/L (ref 4.2–10.2)

## 2024-04-22 LAB — BK VIRUS QUANTITATIVE PCR, BLOOD: BK BLOOD RESULT: NOT DETECTED

## 2024-04-22 LAB — RENAL FUNCTION PANEL
ALBUMIN: 4.1 g/dL (ref 3.4–5.0)
ANION GAP: 11 mmol/L (ref 7–15)
BLOOD UREA NITROGEN: 20 mg/dL — ABNORMAL HIGH (ref 7–18)
BUN / CREAT RATIO: 17
CALCIUM: 9.3 mg/dL (ref 8.5–10.1)
CHLORIDE: 107 mmol/L (ref 97–107)
CO2: 25.2 mmol/L (ref 21.0–32.0)
CREATININE: 1.15 mg/dL — ABNORMAL HIGH (ref 0.51–0.95)
EGFR CKD-EPI (2021) FEMALE: 70 mL/min/1.73m2 (ref >=60)
GLUCOSE RANDOM: 97 mg/dL (ref 70–179)
PHOSPHORUS: 3.5 mg/dL (ref 2.6–4.7)
POTASSIUM: 4 mmol/L (ref 3.3–4.7)
SODIUM: 143 mmol/L (ref 135–145)

## 2024-04-22 LAB — RETICULOCYTES
RETICULOCYTE ABSOLUTE COUNT: 62 10*9/L (ref 27.0–90.0)
RETICULOCYTE COUNT PCT: 1.39 % (ref 0.56–1.85)

## 2024-04-22 LAB — IRON PANEL
IRON SATURATION: 19 % — ABNORMAL LOW (ref 20–55)
IRON: 71 ug/dL (ref 50–170)
TOTAL IRON BINDING CAPACITY: 378 ug/dL (ref 250–450)

## 2024-04-22 LAB — SLIDE REVIEW

## 2024-04-22 LAB — MAGNESIUM: MAGNESIUM: 1.6 mg/dL — ABNORMAL LOW (ref 1.8–2.4)

## 2024-04-22 LAB — CMV DNA, QUANTITATIVE, PCR: CMV VIRAL LD: NOT DETECTED

## 2024-04-22 LAB — PROTEIN / CREATININE RATIO, URINE
CREATININE, URINE: 99.4 mg/dL
PROTEIN URINE: 24.8 mg/dL
PROTEIN/CREAT RATIO, URINE: 0.249

## 2024-04-22 LAB — PARATHYROID HORMONE (PTH): PARATHYROID HORMONE INTACT: 58.7 pg/mL (ref 18.4–80.1)

## 2024-04-22 LAB — EBV QUANTITATIVE PCR, BLOOD: EBV VIRAL LOAD RESULT: NOT DETECTED

## 2024-04-22 NOTE — Progress Notes (Signed)
 Met with pt in clinic today with mom  Donor (deceased) CMV/EBV status:  +/+          Current complaints or concerns:        Fever/cold/flu symptoms no  Headache/Dizziness/Lightheaded: no  Hand tremors: no  Numbness/tingling: no  Fevers/chills/sweats: no  CP/SOB/palpatations: no  Nausea/vomiting/heartburn: no  Diarrhea/constipation: no  UTI symptoms (burn/pain/itch/frequency/urgency/odor/color/foam): no    Admissions since last clinic visit: No  Rejection or infection since last clinic visit: No    Most recent labs available for review:  Lab frequency: monthly and/or prn   Missing getting monthly labs? Majority of months consistent.  if yes, how many times 2x this year  Virals/DSAs updated: ND  Lab orders last updated: good till 08/2024    Knowledge of meds, doses, frequency:Yes  Prescriptions needing refill or updates: No    Fluid intake: Water: 16 oz/day  barely 1 water bottles 16oz/day. Pt has goal again to drink at least 2 16oz water bottles a day.    School progress/ working(Grade level, grades, missing days):working in retail  Social screening (STDs, drugs, free time activities, games, phone time) work and started going to the gym to start an exercise routine.  Sleep schedule: no concerns, no changes  Appetite: no changes. Pt was down 2 pounds today  Mood: happy today, no concerns to voice  Transition readiness: Yes  Video visit completed (Age appropriate): Yes. 07/2023 done. Discussed plan over next 6-8  months for transitioning to adult.  Discussed getting a PCP as well.    Return to peds neph clinic: in 3 months  Referral to other providers: none      G tube  NA  Growth hormone  NA  Birth control NONE    Annual screening (txp RUS/native)(echo prn )  RUS UTD, repeated today 2/2 c/o abd pains since she started working out, pains come and go. Not taking anything for it.   UA done today   Vaccines:  UTD

## 2024-04-22 NOTE — Telephone Encounter (Signed)
 US  transplant kidney ordered today as add on for 1pm at [facility] per Dr. Roel.  Attempted to contact pt. No answer, Message left and artera text sent regarding arrival time and test before her appt at 2pm.

## 2024-04-22 NOTE — Progress Notes (Signed)
 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 Allosure was great on 12/09/21 at 0.28.   Creatinine 07/04/23: 1.15 (range 1-1.5).   Urinalysis today: 1+ blood, 1+ protein, 1+ LE   DSA: None, last checked 01/15/24  Allosure:   0.28 12/09/21  0.21 11/07/22  0.24 03/06/23  0.22 07/24/23  04/11/24 quantity insufficient, needs to be redrawn      # Immunosuppression:   - Prograf  goal 5-7  - MMF 460mg  BID, AUC done 4 weeks post transplant  - Changes in Immunosuppression: none  - Medications side effects: none     # HTN - well controlled  - BP - goal is 110/65 (50th percentile), pretty well achieved  - Continue carvedilol  25mg  BID and amlodipine  2.5mg  nightly in August       # Anemia - at goal  Goal for Hemoglobin >10, currently at goal after 3 doses of ferric gluconate  Iron  saturation improved at 19, up from 7     # Infectious disease  EBV D+/R+, CMV D+/R+  All three viruses negative 01/15/24  Due today     # MBD - Calcium /Phosphorus   PTH 06/15/21: 69.1   Needs PTH yearly with screening labs     # Electrolytes:   No issues.      # Immunizations:   Immunization History   Administered Date(s) Administered    COVID-19 VAC,BIVALENT(23YR UP),PFIZER 04/15/2021    COVID-19 VACC,MRNA,(PFIZER)(PF) 01/16/2020, 02/06/2020    Covid-19 Vac, (50yr+) (Comirnaty) [employer]  04/10/2023    DTaP / IPV 01/16/2008    DTaP, Unspecified Formulation 02/17/2004, 06/21/2004, 08/02/2004, 04/19/2005    HPV Quadrivalent (Gardasil) 01/09/2015, 03/27/2015, 07/24/2015    Hepatitis A Vaccine - Unspecified Formulation 01/17/2005, 07/25/2005    Hepatitis B Vaccine, Unspecified Formulation 2003[birth year]08[birth year], 02/17/2004, 08/02/2004    Hepatitis B vaccine, pediatric/adolescent dosage, 04/15/2021    HiB, unspecified 02/17/2004, 06/21/2004, 08/02/2004    HiB-PRP-OMP 04/19/2005    INFLUENZA VACCINE IIV3(IM)(PF)6 MOS UP 03/28/2024    Influenza Vaccine Quad(IM)6 MO-Adult(PF) 04/09/2021    MENINGOCOCCAL VACCINE, A,C,Y, W-135(IM)(MENVEO) 10/10/2020    MMR 01/17/2005, 01/16/2008    Meningococcal B Vaccine, OMV Adjuvanted(Bexsero) 10/10/2020    Meningococcal Conjugate MCV4P 01/09/2015    Pneumococcal Conjugate 13-Valent 04/15/2021    Pneumococcal conjugate -PCV7 02/17/2004, 06/21/2004, 08/02/2004, 01/17/2005    Polio Virus Vaccine, Unspecified Formulation 02/17/2004, 06/21/2004, 04/19/2005    TdaP 01/09/2015    Varicella 01/17/2005, 01/16/2008       Discharge Medications:     Current Outpatient Medications   Medication Sig Dispense Refill    amlodipine  (NORVASC ) 2.5 MG tablet Take 1 tablet (2.5 mg total) by mouth daily. 30 tablet 11    carvedilol  (COREG ) 25 MG tablet Take  1 tablet (25 mg total) by mouth Two (2) times a day. 60 tablet 8    ferrous sulfate  324 mg (65 mg iron ) EC tablet Take 1 tablet (324 mg total) by mouth every other day. 15 tablet 11    mycophenolate  (MYHIBBIN ) 200 mg/mL suspension Take 2.3 mL by mouth two (2) times a day.   Discard 60 days after opening. 175 mL 11    sumatriptan  (IMITREX ) 20 mg/actuation nasal spray 1 spray (20 mg total) into alternating nostrils once as needed for migraine. May repeat dose in 2 hours if needed. No more than 2 sprays per day. 1 each PRN    tacrolimus  1 mg/mL oral suspension (CAPS) Take 0.8 mL (0.8 mg total) by mouth two (2) times a day. 48 mL 11     No current facility-administered medications for this visit.     I personally spent 45 minutes face-to-face and non-face-to-face in the care of this patient, which includes all pre, intra, and post visit time on the date of service.    Subjective:     Michelle [name] is a 18 y.o. 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      Biopsy 06/17/21 for high creatinine when it was 1.6-1.8 without explanation showed no rejection, maybe some calcineurin inhibitor toxicity, acute tubular injury.      Creatinine has never been as low as expected, but allosures have all been reassuring.      Still working in chief strategy officer at [employer]. Lives at home with her parents. Not dating.    For the past few weeks, she's had graft pain, worse when she exercises. No fever. No dysuria. Creatinine is at baseline.     Review of Systems: ten systems reviewed and negative but for that noted in HPI      Medications:     Current Outpatient Medications   Medication Sig Dispense Refill    amlodipine  (NORVASC ) 2.5 MG tablet Take 1 tablet (2.5 mg total) by mouth daily. 30 tablet 11    carvedilol  (COREG ) 25 MG tablet Take 1 tablet (25 mg total) by mouth Two (2) times a day. 60 tablet 8    ferrous sulfate  324 mg (65 mg iron ) EC tablet Take 1 tablet (324 mg total) by mouth every other day. 15 tablet 11    mycophenolate  (MYHIBBIN ) 200 mg/mL suspension Take 2.3 mL by mouth two (2) times a day.   Discard 60 days after opening. 175 mL 11    sumatriptan  (IMITREX ) 20 mg/actuation nasal spray 1 spray (20 mg total) into alternating nostrils once as needed for migraine. May repeat dose in 2 hours if needed. No more than 2 sprays per day. 1 each PRN    tacrolimus  1 mg/mL oral suspension (CAPS) Take 0.8 mL (0.8 mg total) by mouth two (2) times a day. 48 mL 11     No current facility-administered medications for this visit.       Allergies:     Allergies   Allergen Reactions    Latex      Added based on information entered during case entry, please review and add reactions, type, and severity as needed       Past Medical History:     Past Medical History:   Diagnosis Date  Hyperkalemia 12/17/2020    Hypertension 12/11/2020    Secondary hyperparathyroidism (HHS-HCC) 12/17/2020       Objective:     PE:   BP 115/77 (BP Site: R Arm, BP Position: Sitting)  - Pulse 86  - Temp 36.3 ??C (97.3 ??F) (Temporal)  - Ht 158.9 cm (5' 2.56)  - Wt 75.4 kg (166 lb 3.6 oz)  - LMP 04/01/2024 (Within Days)  - BMI 29.86 kg/m??       General Appearance:  Healthy-appearing, well nourished, alert, interactive  HEENT: Sclerae white, EOMI, moist mucous membranes  Pulm: Normal RR and WOB   Renal:  Graft non-tender to palpation, extremities without edema   Neuro: Alert; normal tone throughout    Labs:   Reviewed in Epic

## 2024-04-23 LAB — TACROLIMUS LEVEL, TROUGH: TACROLIMUS, TROUGH: 4.7 ng/mL — ABNORMAL LOW (ref 5.0–15.0)

## 2024-04-23 NOTE — Progress Notes (Signed)
UNOS form completed

## 2024-04-23 NOTE — Telephone Encounter (Signed)
 Reached out to pt via artera text to let pt know no concerns from her US  done yesterday.

## 2024-04-24 NOTE — Progress Notes (Signed)
 The [facility] has made a second and final attempt to reach this patient to refill the following medication:tacrolimus  1 mg/mL oral suspension (CAPS) and MYHIBBIN  200 mg/mL suspension (mycophenolate ).      We have left voicemails on the following phone numbers: [phone], have sent a MyChart message, and have sent a text message to the following phone numbers: [phone].    Dates contacted: 04/17/24 and 04/24/24  Last scheduled delivery: 03/26/24    The patient may be at risk of non-compliance with this medication. The patient should call the [facility] at [phone]  Option 4, then Option 4: Infectious Disease, Transplant to refill medication.    Michelle [name] [facility]

## 2024-04-25 LAB — ALLOSURE KIDNEY: ALLOSURE: 0.73 %

## 2024-04-29 ENCOUNTER — Encounter: Admit: 2024-04-29 | Discharge: 2024-04-30 | Payer: Medicaid (Managed Care)

## 2024-04-29 DIAGNOSIS — Z940 Kidney transplant status: Principal | ICD-10-CM

## 2024-04-29 DIAGNOSIS — D509 Iron deficiency anemia, unspecified: Principal | ICD-10-CM

## 2024-04-29 LAB — RENAL FUNCTION PANEL
ALBUMIN: 4.1 g/dL (ref 3.4–5.0)
ANION GAP: 14 mmol/L (ref 7–15)
BLOOD UREA NITROGEN: 25 mg/dL — ABNORMAL HIGH (ref 7–18)
BUN / CREAT RATIO: 22
CALCIUM: 9.4 mg/dL (ref 8.5–10.1)
CHLORIDE: 106 mmol/L (ref 97–107)
CO2: 20.8 mmol/L — ABNORMAL LOW (ref 21.0–32.0)
CREATININE: 1.13 mg/dL — ABNORMAL HIGH (ref 0.51–0.95)
EGFR CKD-EPI (2021) FEMALE: 72 mL/min/1.73m2 (ref >=60)
GLUCOSE RANDOM: 138 mg/dL (ref 70–179)
PHOSPHORUS: 3.2 mg/dL (ref 2.6–4.7)
POTASSIUM: 3.8 mmol/L (ref 3.3–4.7)
SODIUM: 141 mmol/L (ref 135–145)

## 2024-04-29 LAB — CBC W/ AUTO DIFF
BASOPHILS ABSOLUTE COUNT: 0 10*9/L (ref 0.0–0.1)
BASOPHILS RELATIVE PERCENT: 0.8 %
EOSINOPHILS ABSOLUTE COUNT: 0.1 10*9/L (ref 0.0–0.5)
EOSINOPHILS RELATIVE PERCENT: 2 %
HEMATOCRIT: 33 % — ABNORMAL LOW (ref 34.0–44.0)
HEMOGLOBIN: 10.7 g/dL — ABNORMAL LOW (ref 11.3–14.9)
LYMPHOCYTES ABSOLUTE COUNT: 0.8 10*9/L — ABNORMAL LOW (ref 1.1–3.6)
LYMPHOCYTES RELATIVE PERCENT: 20.3 %
MEAN CORPUSCULAR HEMOGLOBIN CONC: 32.4 g/dL (ref 32.3–35.0)
MEAN CORPUSCULAR HEMOGLOBIN: 24.8 pg — ABNORMAL LOW (ref 25.9–32.4)
MEAN CORPUSCULAR VOLUME: 76.6 fL — ABNORMAL LOW (ref 77.6–95.7)
MEAN PLATELET VOLUME: 9.4 fL (ref 7.3–10.7)
MONOCYTES ABSOLUTE COUNT: 0.2 10*9/L — ABNORMAL LOW (ref 0.3–0.8)
MONOCYTES RELATIVE PERCENT: 5.2 %
NEUTROPHILS ABSOLUTE COUNT: 2.7 10*9/L (ref 1.5–6.4)
NEUTROPHILS RELATIVE PERCENT: 71.7 %
PLATELET COUNT: 252 10*9/L (ref 170–380)
RED BLOOD CELL COUNT: 4.31 10*12/L (ref 3.95–5.13)
RED CELL DISTRIBUTION WIDTH: 17.5 % — ABNORMAL HIGH (ref 12.2–15.2)
WBC ADJUSTED: 3.7 10*9/L — ABNORMAL LOW (ref 4.2–10.2)

## 2024-04-29 LAB — IRON PANEL
IRON SATURATION: 16 % — ABNORMAL LOW (ref 20–55)
IRON: 56 ug/dL (ref 50–170)
TOTAL IRON BINDING CAPACITY: 359 ug/dL (ref 250–450)

## 2024-04-29 LAB — RETICULOCYTES
RETICULOCYTE ABSOLUTE COUNT: 68.3 10*9/L (ref 27.0–90.0)
RETICULOCYTE COUNT PCT: 1.59 % (ref 0.56–1.85)

## 2024-04-29 MED ADMIN — acetaminophen (TYLENOL) oral liquid: 650 mg | ORAL | @ 16:00:00 | Stop: 2024-04-29

## 2024-04-29 MED ADMIN — diphenhydrAMINE (BENADRYL) oral elixir: 25 mg | ORAL | @ 16:00:00 | Stop: 2024-04-29

## 2024-04-29 MED ADMIN — sodium ferric gluconate (FERRLECIT) 110.5 mg in sodium chloride (NS) 0.9 % 100 mL IVPB: 1.5 mg/kg | INTRAVENOUS | @ 16:00:00 | Stop: 2024-04-29

## 2024-04-29 NOTE — Progress Notes (Signed)
 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     Was a nurse, learning disability used for this call? No    Completed refill call assessment today to schedule patient's medication shipment from the [facility]  [phone]).  All relevant notes have been reviewed.     Specialty medication(s) and dose(s) confirmed: Regimen is correct and unchanged.   Changes to medications: Michelle [name] reports no changes at this time.  Changes to insurance: No  New side effects reported not previously addressed with a pharmacist or physician: None reported  Questions for the pharmacist: No    Confirmed patient received a Conservation Officer, Historic Buildings and a Surveyor, Mining with first shipment. The patient will receive a drug information handout for each medication shipped and additional FDA Medication Guides as required.       DISEASE/MEDICATION-SPECIFIC INFORMATION        N/A    SPECIALTY MEDICATION ADHERENCE     Medication Adherence    Patient reported X missed doses in the last month: 0  Specialty Medication: tacrolimus  1 mg/mL oral suspension (CAPS)  Patient is on additional specialty medications: Yes  Additional Specialty Medications: MYHIBBIN  200 mg/mL suspension (mycophenolate )  Patient Reported Additional Medication X Missed Doses in the Last Month: 0  Patient is on more than two specialty medications: No              Were doses missed due to medication being on hold? No     MYHIBBIN  200 mg/mL suspension (mycophenolate ): 2-3 days of medicine on hand    tacrolimus  1 mg/mL oral suspension (CAPS): 2-3 days of medicine on hand REFERRAL TO PHARMACIST     Referral to the pharmacist: Not needed      [facility]     Shipping address confirmed in Epic.     Cost and Payment: Patient has a $0 copay, payment information is not required.    Delivery Scheduled: Yes, Expected medication delivery date: 05/01/24.     Medication will be delivered via Next Day Courier to the prescription address in Epic WAM.    Michelle [name]   [facility]  Specialty Technician

## 2024-04-29 NOTE — Progress Notes (Signed)
 Shannel in to infusion room with mom for ferrelicit #4/5.   PO benadryl  and tylenol  given as ordered.   PIV inserted to right [facility] x1 attempt by this RN without difficulty. Ordered labs drawn from venous catheter.  Ferrelicit infused over 60 minutes as ordered with no complications. VS remained stable during and 30 mins after infusion. 30 min observation completed.   PIV removed and Patient discharged ambulatory with mom. Next infusion scheduled in 2 weeks, 12/15 at 10 am.

## 2024-04-30 LAB — FSAB CLASS 1 ANTIBODY SPECIFICITY: HLA CLASS 1 ANTIBODY RESULT: NEGATIVE

## 2024-04-30 LAB — HLA DS POST TRANSPLANT
ANTI-DONOR DRW #1 MFI: 0 MFI
ANTI-DONOR HLA-A #1 MFI: 24 MFI
ANTI-DONOR HLA-A #2 MFI: 0 MFI
ANTI-DONOR HLA-B #1 MFI: 0 MFI
ANTI-DONOR HLA-B #2 MFI: 0 MFI
ANTI-DONOR HLA-C #1 MFI: 0 MFI
ANTI-DONOR HLA-DQB #1 MFI: 0 MFI
ANTI-DONOR HLA-DQB #2 MFI: 0 MFI
ANTI-DONOR HLA-DR #1 MFI: 0 MFI
ANTI-DONOR HLA-DR #2 MFI: 0 MFI

## 2024-04-30 LAB — FSAB CLASS 2 ANTIBODY SPECIFICITY: HLA CL2 AB RESULT: NEGATIVE

## 2024-04-30 MED FILL — MYHIBBIN 200 MG/ML ORAL SUSPENSION: ORAL | 34 days supply | Qty: 175 | Fill #8

## 2024-05-10 LAB — CBC W/ DIFFERENTIAL
BANDED NEUTROPHILS ABSOLUTE COUNT: 0 x10E3/uL (ref 0.0–0.1)
BASOPHILS ABSOLUTE COUNT: 0 x10E3/uL (ref 0.0–0.2)
BASOPHILS RELATIVE PERCENT: 1 %
EOSINOPHILS ABSOLUTE COUNT: 0.1 x10E3/uL (ref 0.0–0.4)
EOSINOPHILS RELATIVE PERCENT: 2 %
HEMATOCRIT: 36.3 % (ref 34.0–46.6)
HEMOGLOBIN: 11.5 g/dL (ref 11.1–15.9)
IMMATURE GRANULOCYTES: 0 %
LYMPHOCYTES ABSOLUTE COUNT: 1 x10E3/uL (ref 0.7–3.1)
LYMPHOCYTES RELATIVE PERCENT: 19 %
MEAN CORPUSCULAR HEMOGLOBIN CONC: 31.7 g/dL (ref 31.5–35.7)
MEAN CORPUSCULAR HEMOGLOBIN: 26.3 pg — ABNORMAL LOW (ref 26.6–33.0)
MEAN CORPUSCULAR VOLUME: 83 fL (ref 79–97)
MONOCYTES ABSOLUTE COUNT: 0.3 x10E3/uL (ref 0.1–0.9)
MONOCYTES RELATIVE PERCENT: 7 %
NEUTROPHILS ABSOLUTE COUNT: 3.8 x10E3/uL (ref 1.4–7.0)
NEUTROPHILS RELATIVE PERCENT: 71 %
PLATELET COUNT: 305 x10E3/uL (ref 150–450)
RED BLOOD CELL COUNT: 4.38 x10E6/uL (ref 3.77–5.28)
RED CELL DISTRIBUTION WIDTH: 16.3 % — ABNORMAL HIGH (ref 11.7–15.4)
WHITE BLOOD CELL COUNT: 5.2 x10E3/uL (ref 3.4–10.8)

## 2024-05-10 LAB — RENAL FUNCTION PANEL
ALBUMIN: 4.6 g/dL (ref 4.0–5.0)
BLOOD UREA NITROGEN: 15 mg/dL (ref 6–20)
BUN / CREAT RATIO: 13 (ref 9–23)
CALCIUM: 9.7 mg/dL (ref 8.7–10.2)
CHLORIDE: 104 mmol/L (ref 96–106)
CO2: 19 mmol/L — ABNORMAL LOW (ref 20–29)
CREATININE: 1.12 mg/dL — ABNORMAL HIGH (ref 0.57–1.00)
EGFR: 72 mL/min/1.73
GLUCOSE: 90 mg/dL (ref 70–99)
PHOSPHORUS, SERUM: 3.7 mg/dL (ref 3.0–4.3)
POTASSIUM: 4.1 mmol/L (ref 3.5–5.2)
SODIUM: 141 mmol/L (ref 134–144)

## 2024-05-11 LAB — TACROLIMUS LEVEL: TACROLIMUS BLOOD: 4.4 ng/mL — ABNORMAL LOW (ref 5.0–20.0)

## 2024-05-12 DIAGNOSIS — D509 Iron deficiency anemia, unspecified: Principal | ICD-10-CM

## 2024-05-13 ENCOUNTER — Encounter: Admit: 2024-05-13 | Discharge: 2024-05-14 | Payer: Medicaid (Managed Care)

## 2024-05-13 DIAGNOSIS — D509 Iron deficiency anemia, unspecified: Principal | ICD-10-CM

## 2024-05-13 DIAGNOSIS — Z940 Kidney transplant status: Principal | ICD-10-CM

## 2024-05-13 LAB — CBC W/ AUTO DIFF
BASOPHILS ABSOLUTE COUNT: 0 10*9/L (ref 0.0–0.1)
BASOPHILS RELATIVE PERCENT: 0.9 %
EOSINOPHILS ABSOLUTE COUNT: 0.1 10*9/L (ref 0.0–0.5)
EOSINOPHILS RELATIVE PERCENT: 1.3 %
HEMATOCRIT: 33.2 % — ABNORMAL LOW (ref 34.0–44.0)
HEMOGLOBIN: 11.3 g/dL (ref 11.3–14.9)
LYMPHOCYTES ABSOLUTE COUNT: 0.9 10*9/L — ABNORMAL LOW (ref 1.1–3.6)
LYMPHOCYTES RELATIVE PERCENT: 19.6 %
MEAN CORPUSCULAR HEMOGLOBIN CONC: 33.9 g/dL (ref 32.3–35.0)
MEAN CORPUSCULAR HEMOGLOBIN: 26.3 pg (ref 25.9–32.4)
MEAN CORPUSCULAR VOLUME: 77.6 fL (ref 77.6–95.7)
MEAN PLATELET VOLUME: 9.4 fL (ref 7.3–10.7)
MONOCYTES ABSOLUTE COUNT: 0.3 10*9/L (ref 0.3–0.8)
MONOCYTES RELATIVE PERCENT: 6.6 %
NEUTROPHILS ABSOLUTE COUNT: 3.3 10*9/L (ref 1.5–6.4)
NEUTROPHILS RELATIVE PERCENT: 71.6 %
PLATELET COUNT: 232 10*9/L (ref 170–380)
RED BLOOD CELL COUNT: 4.28 10*12/L (ref 3.95–5.13)
RED CELL DISTRIBUTION WIDTH: 17.6 % — ABNORMAL HIGH (ref 12.2–15.2)
WBC ADJUSTED: 4.5 10*9/L (ref 4.2–10.2)

## 2024-05-13 LAB — RENAL FUNCTION PANEL
ALBUMIN: 3.9 g/dL (ref 3.4–5.0)
ANION GAP: 11 mmol/L (ref 7–15)
BLOOD UREA NITROGEN: 19 mg/dL — ABNORMAL HIGH (ref 7–18)
BUN / CREAT RATIO: 19
CALCIUM: 9.1 mg/dL (ref 8.5–10.1)
CHLORIDE: 106 mmol/L (ref 97–107)
CO2: 24.8 mmol/L (ref 21.0–32.0)
CREATININE: 1.01 mg/dL — ABNORMAL HIGH (ref 0.51–0.95)
EGFR CKD-EPI (2021) FEMALE: 82 mL/min/1.73m2 (ref >=60)
GLUCOSE RANDOM: 101 mg/dL (ref 70–179)
PHOSPHORUS: 3.6 mg/dL (ref 2.6–4.7)
POTASSIUM: 3.7 mmol/L (ref 3.3–4.7)
SODIUM: 142 mmol/L (ref 135–145)

## 2024-05-13 MED ORDER — TACROLIMUS ORAL SUS 1MG/ML (CAPS)
Freq: Two times a day (BID) | ORAL | 11 refills | 30.00000 days | Status: CP
Start: 2024-05-13 — End: ?

## 2024-05-13 MED ADMIN — diphenhydrAMINE (BENADRYL) oral elixir: 25 mg | ORAL | @ 15:00:00 | Stop: 2024-05-13

## 2024-05-13 MED ADMIN — acetaminophen (TYLENOL) oral liquid: 650 mg | ORAL | @ 15:00:00 | Stop: 2024-05-13

## 2024-05-13 MED ADMIN — sodium ferric gluconate (FERRLECIT) 110.5 mg in sodium chloride (NS) 0.9 % 100 mL IVPB: 1.5 mg/kg | INTRAVENOUS | @ 16:00:00 | Stop: 2024-05-13

## 2024-05-13 NOTE — Addendum Note (Signed)
 Addended by: Treacy Holcomb R on: 05/13/2024 11:19 AM     Modules accepted: Orders

## 2024-05-13 NOTE — Telephone Encounter (Signed)
 Transplant Lab Follow-Up    Patient was called to discuss laboratory results. Tacrolimus  level was low at 4.4 ng/mL (goal 5-7 ng/mL). Plan to increase tacrolimus  (Prograf ) dose to 0.9 mg every 12 hours. Patient will get repeat labs in 1 week. Plan to follow-up with other labwork as results made available. All questions answered. Patient verbalized understanding & agreed with the plan.     Laboratory Results Summary     Immunosuppression  Administration Times: 0800/2000  Current Regimen: Tacrolimus  0.8 mg every 12 hours  NEW Regimen: Tacrolimus  0.9 mg every 12 hours  Trough Goal: 5-7 ng/mL    Lab Results   Component Value Date    TACROLIMUS  4.4 (L) 05/09/2024    TACROLIMUS  4.7 (L) 04/22/2024    TACROLIMUS  10.1 04/03/2024     Infectious Disease/Viral Testing    Lab Results   Component Value Date    CMV Viral Ld Not Detected 04/22/2024     Lab Results   Component Value Date    CMV Quant Negative 06/06/2023     Other Laboratory Results  Lab Results   Component Value Date    BUN 15 05/09/2024    CREATININE 1.12 (H) 05/09/2024    K 4.1 05/09/2024    GLU 138 04/29/2024    MG 1.6 (L) 04/22/2024     Lab Results   Component Value Date    WBC 5.2 05/09/2024    HGB 11.5 05/09/2024    HCT 36.3 05/09/2024    PLT 305 05/09/2024    NEUTROABS 3.8 05/09/2024       Milly FABIENE Moder, PharmD, BCPPS, CPP  Clinical Pharmacist Practitioner - Pediatric Solid Organ Transplant

## 2024-05-13 NOTE — Progress Notes (Signed)
 Dinia in to infusion room with brother for ferrlecit  #5/5. VSS and patient is well appearing upon arrival. Premeds of tylenol  and benadryl  given. PIV inserted to right [facility] by this RN x1 attempt without difficulty.   Iron  infused over 1 hour with no complications. Vital signs remained stable throughout.  PIV removed and patient discharged ambulatory with brother.   No further infusions scheduled at this time; plan is for patient to follow up with nephrology.

## 2024-05-14 NOTE — Progress Notes (Addendum)
 [facility] Pharmacist has reviewed a new prescription for tacrolimus  that indicates a dose increase.  Patient was counseled on this dosage change by cpp JP- see epic note from 12/15.  Next refill call date adjusted if necessary.        Clinical Assessment Needed For: Dose Change  Medication: Tacrolimus  1 mg/ml  Last Fill Date/Day Supply: 05/02/24 / 30  Copay $0.00  Was previous dose already scheduled to fill: No    Notes to Pharmacist: n/a

## 2024-05-20 NOTE — Progress Notes (Signed)
 [facility] Pharm[facility] Refill Coordination Note    Specialty Medication(s) to be Shipped:   Transplant: tacrolimus  oral suspension 1mg /ml    Other medication(s) to be shipped: No additional medications requested for fill at this time    Specialty Medications not needed at this time: N/A     Michelle [name], DOB: 2005/09/30  Phone: [phone] (home)       All above HIPAA information was verified with patient.     Was a nurse, learning disability used for this call? No    Completed refill call assessment today to schedule patient's medication shipment from the [facility]  [phone]).  All relevant notes have been reviewed.     Specialty medication(s) and dose(s) confirmed: Regimen is correct and unchanged.   Changes to medications: Anitria reports no changes at this time.  Changes to insurance: No  New side effects reported not previously addressed with a pharmacist or physician: None reported  Questions for the pharmacist: No    Confirmed patient received a Conservation Officer, Historic Buildings and a Surveyor, Mining with first shipment. The patient will receive a drug information handout for each medication shipped and additional FDA Medication Guides as required.       DISEASE/MEDICATION-SPECIFIC INFORMATION        N/A    SPECIALTY MEDICATION ADHERENCE     Medication Adherence    Patient reported X missed doses in the last month: 0  Specialty Medication: tacrolimus  1 mg/mL oral suspension (CAPS)  Patient is on additional specialty medications: No  Patient is on more than two specialty medications: No              Were doses missed due to medication being on hold? No      tacrolimus  1 mg/mL oral suspension (CAPS): 2 days of medicine on hand         Specialty medication is an injection or given on a cycle: No    REFERRAL TO PHARMACIST     Referral to the pharmacist: Not needed      [facility]     Shipping address confirmed in Epic.     Cost and Payment: Patient has a $0 copay, payment information is not required.    Delivery Scheduled: Yes, Expected medication delivery date: 05/24/24.     Medication will be delivered via Next Day Courier to the prescription address in Epic WAM.    [facility]  Specialty Technician

## 2024-05-21 NOTE — Progress Notes (Signed)
 Michelle [name] 's tacrolimus  1 mg/mL oral suspension (CAPS) shipment will be rescheduled as a result of the medication is too soon to refill until 05/28/24.     I have reached out to the patient  at [phone] and communicated the delivery change. We will reschedule the medication for the delivery date that the patient agreed upon.  We have confirmed the delivery date as 05/29/24, via next day courier.

## 2024-05-25 DIAGNOSIS — Z940 Kidney transplant status: Principal | ICD-10-CM

## 2024-05-25 MED ORDER — PREDNISONE 50 MG TABLET
ORAL_TABLET | Freq: Every day | ORAL | 0 refills | 5.00000 days | Status: CP
Start: 2024-05-25 — End: 2024-05-30

## 2024-05-25 MED ORDER — TACROLIMUS 1 MG CAPSULE, IMMEDIATE-RELEASE
ORAL_CAPSULE | Freq: Two times a day (BID) | ORAL | 0 refills | 5.00000 days | Status: CP
Start: 2024-05-25 — End: 2024-05-30
  Filled 2024-05-28: qty 54, 30d supply, fill #0

## 2024-05-28 NOTE — Telephone Encounter (Signed)
 Spoke with pt today regarding mychart message of medication needs. Pt voiced her delivery was due to come last Friday and has not arrived. She had some tc pills called into local pharmacy, she has enough for tonight and in the am. She is running low on cellcept  too. Pt will reach out regarding delivery and get back with me if both these rx are needed for several more days from local pharmacy. Pt voiced understanding.

## 2024-06-01 DIAGNOSIS — Z940 Kidney transplant status: Principal | ICD-10-CM

## 2024-06-01 MED ORDER — MYCOPHENOLATE MOFETIL 250 MG CAPSULE
ORAL_CAPSULE | Freq: Two times a day (BID) | ORAL | 0 refills | 5.00000 days | Status: CP
Start: 2024-06-01 — End: 2024-06-06

## 2024-06-03 DIAGNOSIS — Z940 Kidney transplant status: Principal | ICD-10-CM

## 2024-06-03 MED ORDER — MYHIBBIN 200 MG/ML ORAL SUSPENSION
Freq: Two times a day (BID) | ORAL | 11 refills | 38.00000 days | Status: CP
Start: 2024-06-03 — End: ?

## 2024-06-06 NOTE — Progress Notes (Signed)
 The [facility] has made a second and final attempt to reach this patient to refill the following medication:MYHIBBIN  200 mg/mL suspension (mycophenolate ) .      We have left voicemails on the following phone numbers: [phone], have sent a MyChart message, and have sent a text message to the following phone numbers: [phone].    Dates contacted: 06/03/24 and 06/06/24  Last scheduled delivery: 04/30/24 (34 days supply)    The patient may be at risk of non-compliance with this medication. The patient should call the [facility] at [phone]  Option 4, then Option 4: Infectious Disease, Transplant to refill medication.    Dwan Fennel [facility]

## 2024-06-07 LAB — CBC W/ DIFFERENTIAL
BANDED NEUTROPHILS ABSOLUTE COUNT: 0 x10E3/uL (ref 0.0–0.1)
BASOPHILS ABSOLUTE COUNT: 0 x10E3/uL (ref 0.0–0.2)
BASOPHILS RELATIVE PERCENT: 1 %
EOSINOPHILS ABSOLUTE COUNT: 0.1 x10E3/uL (ref 0.0–0.4)
EOSINOPHILS RELATIVE PERCENT: 1 %
HEMATOCRIT: 37.1 % (ref 34.0–46.6)
HEMOGLOBIN: 11.6 g/dL (ref 11.1–15.9)
IMMATURE GRANULOCYTES: 0 %
LYMPHOCYTES ABSOLUTE COUNT: 1.5 x10E3/uL (ref 0.7–3.1)
LYMPHOCYTES RELATIVE PERCENT: 25 %
MEAN CORPUSCULAR HEMOGLOBIN CONC: 31.3 g/dL — ABNORMAL LOW (ref 31.5–35.7)
MEAN CORPUSCULAR HEMOGLOBIN: 26.1 pg — ABNORMAL LOW (ref 26.6–33.0)
MEAN CORPUSCULAR VOLUME: 83 fL (ref 79–97)
MONOCYTES ABSOLUTE COUNT: 0.5 x10E3/uL (ref 0.1–0.9)
MONOCYTES RELATIVE PERCENT: 8 %
NEUTROPHILS ABSOLUTE COUNT: 3.9 x10E3/uL (ref 1.4–7.0)
NEUTROPHILS RELATIVE PERCENT: 65 %
PLATELET COUNT (LABCORP): 307 x10E3/uL (ref 150–450)
RED BLOOD CELL COUNT: 4.45 x10E6/uL (ref 3.77–5.28)
RED CELL DISTRIBUTION WIDTH: 15.9 % — ABNORMAL HIGH (ref 11.7–15.4)
WHITE BLOOD CELL COUNT: 6 x10E3/uL (ref 3.4–10.8)

## 2024-06-07 LAB — RENAL FUNCTION PANEL
ALBUMIN: 4.3 g/dL (ref 4.0–5.0)
BLOOD UREA NITROGEN: 18 mg/dL (ref 6–20)
BUN / CREAT RATIO: 15 (ref 9–23)
CALCIUM: 9.7 mg/dL (ref 8.7–10.2)
CHLORIDE: 104 mmol/L (ref 96–106)
CO2: 21 mmol/L (ref 20–29)
CREATININE: 1.19 mg/dL — ABNORMAL HIGH (ref 0.57–1.00)
EGFR: 67 mL/min/1.73
GLUCOSE: 96 mg/dL (ref 70–99)
PHOSPHORUS, SERUM: 3.8 mg/dL (ref 3.0–4.3)
POTASSIUM: 4 mmol/L (ref 3.5–5.2)
SODIUM: 139 mmol/L (ref 134–144)

## 2024-06-08 LAB — TACROLIMUS LEVEL: TACROLIMUS BLOOD: 9.1 ng/mL (ref 5.0–20.0)

## 2024-06-10 NOTE — Telephone Encounter (Signed)
 Transplant Lab Follow-Up    Patient was called to discuss laboratory results. Tacrolimus  level was elevated at 9.1 ng/mL (goal 5-7 ng/mL). Plan to continue current tacrolimus  (Prograf ) dose of 0.9 mg every 12 hours given prior stability on regimen. Patient will get repeat labs in 2 weeks. Plan to follow-up with other labwork as results made available. All questions answered. Patient verbalized understanding & agreed with the plan.     Laboratory Results Summary     Immunosuppression  Administration Times: 0800/2000  Current Regimen: Tacrolimus  0.8 mg every 12 hours  NEW Regimen: Tacrolimus  0.9 mg every 12 hours  Trough Goal: 5-7 ng/mL    Lab Results   Component Value Date    TACROLIMUS  9.1 06/06/2024    TACROLIMUS  4.4 (L) 05/09/2024    TACROLIMUS  4.7 (L) 04/22/2024     Infectious Disease/Viral Testing    Lab Results   Component Value Date    CMV Viral Ld Not Detected 04/22/2024     Lab Results   Component Value Date    CMV Quant Negative 06/06/2023     Other Laboratory Results  Lab Results   Component Value Date    BUN 18 06/06/2024    CREATININE 1.19 (H) 06/06/2024    K 4.0 06/06/2024    GLU 101 05/13/2024    MG 1.6 (L) 04/22/2024     Lab Results   Component Value Date    WBC 6.0 06/06/2024    HGB 11.6 06/06/2024    HCT 37.1 06/06/2024    PLT 307 06/06/2024    NEUTROABS 3.9 06/06/2024       Milly FABIENE Moder, PharmD, BCPPS, CPP  Clinical Pharmacist Practitioner - Pediatric Solid Organ Transplant

## 2024-06-12 DIAGNOSIS — Z940 Kidney transplant status: Principal | ICD-10-CM

## 2024-06-12 MED ORDER — MYCOPHENOLATE MOFETIL 250 MG CAPSULE
ORAL_CAPSULE | 0 refills | 0.00000 days
Start: 2024-06-12 — End: ?

## 2024-06-13 DIAGNOSIS — Z940 Kidney transplant status: Principal | ICD-10-CM

## 2024-06-13 MED ORDER — MYCOPHENOLATE MOFETIL 250 MG CAPSULE
ORAL_CAPSULE | Freq: Two times a day (BID) | ORAL | 11 refills | 30.00000 days | Status: CP
Start: 2024-06-13 — End: ?

## 2024-06-13 MED ORDER — TACROLIMUS 1 MG CAPSULE, IMMEDIATE-RELEASE
ORAL_CAPSULE | Freq: Two times a day (BID) | ORAL | 11 refills | 30.00000 days | Status: CP
Start: 2024-06-13 — End: ?

## 2024-06-20 NOTE — Telephone Encounter (Signed)
 Spoke with pt regarding communications with transplant team and mychart messages and to remind her to go for labs next week for tac level.  Pt voiced she will get a notification for any mychart messages, she has not had issues seeing messages from me. Pt admitted she hasn't been in mychart in a while and doesn't regularly check. Encouraged pt to keep up more frequently to check her mychart as she shifts into adult side of care in near future she will need to more mindful of mychart communications with team. Pt voiced understanding and agreed with plan to check it more.  And will plan for labs next week.
